# Patient Record
Sex: Female | Born: 1985 | Race: White | Hispanic: No | Marital: Married | Smoking: Never smoker
Health system: Southern US, Community
[De-identification: ages and names within clinical notes are randomized; demographics above are authoritative.]

## PROBLEM LIST (undated history)

## (undated) DIAGNOSIS — R519 Headache, unspecified: Secondary | ICD-10-CM

## (undated) DIAGNOSIS — F419 Anxiety disorder, unspecified: Secondary | ICD-10-CM

## (undated) DIAGNOSIS — D649 Anemia, unspecified: Secondary | ICD-10-CM

## (undated) DIAGNOSIS — F32A Depression, unspecified: Secondary | ICD-10-CM

## (undated) DIAGNOSIS — R87619 Unspecified abnormal cytological findings in specimens from cervix uteri: Secondary | ICD-10-CM

## (undated) DIAGNOSIS — N390 Urinary tract infection, site not specified: Secondary | ICD-10-CM

## (undated) DIAGNOSIS — F329 Major depressive disorder, single episode, unspecified: Secondary | ICD-10-CM

## (undated) DIAGNOSIS — F988 Other specified behavioral and emotional disorders with onset usually occurring in childhood and adolescence: Secondary | ICD-10-CM

## (undated) DIAGNOSIS — Z8759 Personal history of other complications of pregnancy, childbirth and the puerperium: Secondary | ICD-10-CM

## (undated) DIAGNOSIS — O1490 Unspecified pre-eclampsia, unspecified trimester: Secondary | ICD-10-CM

## (undated) DIAGNOSIS — M67471 Ganglion, right ankle and foot: Secondary | ICD-10-CM

## (undated) DIAGNOSIS — B019 Varicella without complication: Secondary | ICD-10-CM

## (undated) DIAGNOSIS — J309 Allergic rhinitis, unspecified: Secondary | ICD-10-CM

## (undated) DIAGNOSIS — R87629 Unspecified abnormal cytological findings in specimens from vagina: Secondary | ICD-10-CM

## (undated) HISTORY — DX: Ganglion, right ankle and foot: M67.471

## (undated) HISTORY — DX: Other specified behavioral and emotional disorders with onset usually occurring in childhood and adolescence: F98.8

## (undated) HISTORY — DX: Unspecified pre-eclampsia, unspecified trimester: O14.90

## (undated) HISTORY — DX: Personal history of other complications of pregnancy, childbirth and the puerperium: Z87.59

## (undated) HISTORY — DX: Major depressive disorder, single episode, unspecified: F32.9

## (undated) HISTORY — DX: Allergic rhinitis, unspecified: J30.9

## (undated) HISTORY — DX: Depression, unspecified: F32.A

## (undated) HISTORY — DX: Varicella without complication: B01.9

## (undated) HISTORY — DX: Headache, unspecified: R51.9

## (undated) HISTORY — DX: Urinary tract infection, site not specified: N39.0

## (undated) HISTORY — DX: Anemia, unspecified: D64.9

## (undated) HISTORY — DX: Unspecified abnormal cytological findings in specimens from cervix uteri: R87.619

## (undated) HISTORY — DX: Anxiety disorder, unspecified: F41.9

---

## 2009-01-22 ENCOUNTER — Emergency Department: Admit: 2009-01-22 | Payer: Self-pay | Source: Emergency Department | Admitting: Emergency Medicine

## 2009-01-22 LAB — CBC AND DIFFERENTIAL
Basophils Absolute: 0 /mm3 (ref 0.0–0.2)
Basophils: 0 % (ref 0–2)
Eosinophils Absolute: 0 /mm3 (ref 0.0–0.7)
Eosinophils: 0 % (ref 0–5)
Granulocytes Absolute: 6.6 /mm3 (ref 1.8–8.1)
Hematocrit: 39.1 % (ref 37.0–47.0)
Hgb: 12.9 G/DL (ref 12.0–16.0)
Immature Granulocytes Absolute: 0
Immature Granulocytes: 0 %
Lymphocytes Absolute: 3.6 /mm3 (ref 0.5–4.4)
Lymphocytes: 34 % (ref 15–41)
MCH: 28 PG (ref 28.0–32.0)
MCHC: 33 G/DL (ref 32.0–36.0)
MCV: 85 FL (ref 80.0–100.0)
MPV: 10.9 FL (ref 9.4–12.3)
Monocytes Absolute: 0.5 /mm3 (ref 0.0–1.2)
Monocytes: 4 % (ref 0–11)
Neutrophils %: 61 % (ref 52–75)
Platelets: 274 /mm3 (ref 140–400)
RBC: 4.6 /mm3 (ref 4.20–5.40)
RDW: 13.2 % (ref 11.5–15.0)
WBC: 10.81 /mm3 — ABNORMAL HIGH (ref 3.50–10.80)

## 2009-01-22 LAB — GFR

## 2009-01-22 LAB — BASIC METABOLIC PANEL
BUN: 8 mg/dL (ref 8–20)
CO2: 21 mEq/L (ref 21–30)
Calcium: 8.9 mg/dL (ref 8.6–10.2)
Chloride: 107 mEq/L (ref 98–107)
Creatinine: 0.7 mg/dL (ref 0.6–1.5)
Glucose: 97 mg/dL (ref 70–100)
Potassium: 3.5 mEq/L — ABNORMAL LOW (ref 3.6–5.0)
Sodium: 140 mEq/L (ref 136–146)

## 2009-01-22 LAB — ETHANOL: Alcohol: 158 mg/dL

## 2009-10-31 HISTORY — PX: REFRACTIVE SURGERY: SHX103

## 2014-05-08 LAB — LIPID PANEL
Cholesterol: 204 — AB (ref 0–200)
HDL: 93 — AB (ref 35–70)
LDL CALC: 103
LDL/HDL RATIO: 2.2
TRIGLYCERIDES: 39 — AB (ref 40–160)

## 2014-05-08 LAB — CBC AND DIFFERENTIAL
HEMATOCRIT: 42 (ref 36–46)
HEMOGLOBIN: 13.8 (ref 12.0–16.0)
Neutrophils Absolute: 5
PLATELETS: 308 (ref 150–399)
WBC: 7.6

## 2014-05-08 LAB — VITAMIN B12: Vitamin B-12: 672

## 2014-05-08 LAB — HEPATIC FUNCTION PANEL
ALK PHOS: 56 (ref 25–125)
ALT: 17 (ref 7–35)
AST: 17 (ref 13–35)
BILIRUBIN, TOTAL: 0.4

## 2014-05-08 LAB — BASIC METABOLIC PANEL
BUN: 14 (ref 4–21)
CREATININE: 0.8 (ref 0.5–1.1)
Glucose: 94
Potassium: 4.2 (ref 3.4–5.3)
Sodium: 138 (ref 137–147)

## 2014-05-08 LAB — HEMOGLOBIN A1C: Hemoglobin A1C: 5.2

## 2014-05-08 LAB — TSH: TSH: 0.62 (ref 0.41–5.90)

## 2014-05-08 LAB — VITAMIN D 25 HYDROXY (VIT D DEFICIENCY, FRACTURES): VIT D 25 HYDROXY: 25

## 2015-05-14 LAB — HEPATIC FUNCTION PANEL
ALT: 15 (ref 7–35)
AST: 23 (ref 13–35)
Alkaline Phosphatase: 47 (ref 25–125)
BILIRUBIN, TOTAL: 0.4

## 2015-05-14 LAB — LIPID PANEL
CHOLESTEROL: 191 (ref 0–200)
HDL: 71 — AB (ref 35–70)
LDL CALC: 108
TRIGLYCERIDES: 62 (ref 40–160)

## 2015-05-14 LAB — BASIC METABOLIC PANEL
BUN: 11 (ref 4–21)
Creatinine: 0.6 (ref 0.5–1.1)
Glucose: 81
Potassium: 4 (ref 3.4–5.3)
SODIUM: 136 — AB (ref 137–147)

## 2015-05-14 LAB — CBC AND DIFFERENTIAL
HCT: 39 (ref 36–46)
HEMOGLOBIN: 12.8 (ref 12.0–16.0)
NEUTROS ABS: 4
PLATELETS: 321 (ref 150–399)
WBC: 7.2

## 2015-05-14 LAB — TSH: TSH: 0.97 (ref 0.41–5.90)

## 2015-05-14 LAB — HEMOGLOBIN A1C: Hemoglobin A1C: 5

## 2015-05-14 LAB — VITAMIN D 25 HYDROXY (VIT D DEFICIENCY, FRACTURES): Vit D, 25-Hydroxy: 44.3

## 2016-04-14 ENCOUNTER — Encounter: Payer: Self-pay | Admitting: Advanced Practice Midwife

## 2016-04-14 ENCOUNTER — Ambulatory Visit: Payer: No Typology Code available for payment source | Admitting: Advanced Practice Midwife

## 2016-04-14 VITALS — BP 109/72 | Ht 63.0 in | Wt 173.4 lb

## 2016-04-14 DIAGNOSIS — Z3A08 8 weeks gestation of pregnancy: Secondary | ICD-10-CM

## 2016-04-14 DIAGNOSIS — Z369 Encounter for antenatal screening, unspecified: Secondary | ICD-10-CM

## 2016-04-14 NOTE — Progress Notes (Signed)
Last pap 10/2014  Yes cats  No travel to [place] country  Yes chicken pox

## 2016-04-14 NOTE — Progress Notes (Signed)
Subjective  This is the initial OB visit for Casey Banks she is a G1P0 who is a new patient in our practice.  She is at 8 wks and 0 days per LMP. Patient's last menstrual period was 02/18/2016. She has not yet had a dating ultrasound.  Estimated Date of Delivery: 11/24/16.  Menstrual cycles are slightyly irregular, cycles every 28-32 days.  Her obstetrical history is significant for: None first pregnancy. Her medical history is significant for depression and anxiety she was taking welbutrin but stopped when she found out she was pregnant.  Relationship status:FOB/Husband living together. Patient does intend to breast feed. Current complaints include very mild nausea and breast tenderness.    The following portions of the patient's history were reviewed and updated as needed: allergies, current medications, past family history, past medical history, past surgical history, social history and problem list.    Objective  Physical exam as noted in chart.    Assessment:   Presumed IUP at 8 wks 0 days      Plan:  Initial Ob labs drawn, beta hcg and CF  Pap Smear 10/2014 normal  CF-drawn today  Discussed prenatal vitamins, iron and stool softeners. Nutrition and weight gain.   Reviewed prenatal booklet  Questions answered   Role of Ultrasound in pregnancy discussed, including Level II anatomy scan at 20 weeks. Order given for dating/viability sono.   Discussed genetic testing as appropriate (CF, SMA, Fragile X, Tay-Sachs):   Discussed prenatal screenings and diagnostic tests options reviewed (NT, CVS, Amniocentesis, free cell DNA testing): Order given for NT at [facility]  AFP/Quad screen discussed as appropriate,will draw between 15-18 weeks if pt requests  Referrals - [facility] for NT, and order for dating/viability sono.   Follow up in 4 weeks

## 2016-04-15 ENCOUNTER — Encounter (HOSPITAL_BASED_OUTPATIENT_CLINIC_OR_DEPARTMENT_OTHER): Payer: Self-pay

## 2016-04-15 ENCOUNTER — Encounter: Payer: Self-pay | Admitting: Advanced Practice Midwife

## 2016-04-15 ENCOUNTER — Other Ambulatory Visit: Payer: Self-pay | Admitting: Advanced Practice Midwife

## 2016-04-15 LAB — TOXOPLASMA ABS, IGG/IGM, SERUM
Toxoplasma Gondii, IgG: <3 IU/mL (ref 0.0–7.1)
Toxoplasma gondii Ab,IgM,Qn: <3 AU/mL (ref 0.0–7.9)

## 2016-04-16 LAB — OB PANEL: CBCD,UA/CX, ABO/RH/AB SCREEN, RPR-RFX, HIV, HBSAG, HCV, RUB
AB Screen Gel: NEGATIVE
Baso(Absolute): 0 10*3/uL (ref 0.0–0.2)
Basos: 0 %
Bilirubin, UA: NEGATIVE
Blood, UA: NEGATIVE
Eos: 3 %
Eosinophils Absolute: 0.2 10*3/uL (ref 0.0–0.4)
Glucose, UA: NEGATIVE
HCV AB: <0.1 s/co ratio (ref 0.0–0.9)
HIV Screen 4th Generation wRfx: NONREACTIVE
Hematocrit: 36.1 % (ref 34.0–46.6)
Hemoglobin: 11.4 g/dL (ref 11.1–15.9)
Hepatitis B Surface Antigen: NEGATIVE
Immature Granulocytes Absolute: 0 10*3/uL (ref 0.0–0.1)
Immature Granulocytes: 0 %
Ketones UA: NEGATIVE
Leukocyte Esterase, UA: NEGATIVE
Lymphocytes Absolute: 2.3 10*3/uL (ref 0.7–3.1)
Lymphocytes: 24 %
MCH: 28.9 pg (ref 26.6–33.0)
MCHC: 31.6 g/dL (ref 31.5–35.7)
MCV: 91 fL (ref 79–97)
Monocytes Absolute: 0.6 10*3/uL (ref 0.1–0.9)
Monocytes: 6 %
Neutrophils Absolute: 6.5 10*3/uL (ref 1.4–7.0)
Neutrophils: 67 %
Nitrite, UA: NEGATIVE
Platelets: 331 10*3/uL (ref 150–379)
Protein, UA: NEGATIVE
RBC: 3.95 x10E6/uL (ref 3.77–5.28)
RDW: 13.6 % (ref 12.3–15.4)
RPR: NONREACTIVE
Result 1:: NO GROWTH
Rh Factor: POSITIVE
Rubella AB, IgG: 2.07 index (ref >0.99)
Urine Specific Gravity: 1.01 (ref 1.005–1.030)
Urobilinogen, Ur: 0.2 mg/dL (ref 0.2–1.0)
WBC: 9.6 10*3/uL (ref 3.4–10.8)
pH, UA: 7 (ref 5.0–7.5)

## 2016-04-16 LAB — HCG QUANTITATIVE: human chorionic gonadotropin (hCG), Beta Chain, Quant., S: 60098 m[IU]/mL

## 2016-04-16 LAB — INTERPRETATION:

## 2016-04-18 NOTE — Progress Notes (Signed)
Quick Note:    Dating/viability sono consistent with LMP. + FHR.  ______

## 2016-04-18 NOTE — Progress Notes (Signed)
Quick Note:    Normal prenatal labs. Toxo negative. Waiting on CF.  ______

## 2016-04-22 ENCOUNTER — Telehealth: Payer: Self-pay | Admitting: Advanced Practice Midwife

## 2016-04-22 LAB — CYSTIC FIBROSIS (CF) PROFILE, 97 MUTATIONS, CF PLUS: Interpretation:: NOT DETECTED

## 2016-04-22 NOTE — Telephone Encounter (Signed)
Spoke with patient and gave negative CF screening results.

## 2016-04-22 NOTE — Progress Notes (Signed)
Quick Note:    CF negative, please let her know. Thanks  ______

## 2016-04-27 ENCOUNTER — Other Ambulatory Visit: Payer: Self-pay | Admitting: Obstetrics & Gynecology

## 2016-04-27 ENCOUNTER — Other Ambulatory Visit: Payer: Self-pay | Admitting: Advanced Practice Midwife

## 2016-04-27 DIAGNOSIS — Z369 Encounter for antenatal screening, unspecified: Secondary | ICD-10-CM

## 2016-04-27 DIAGNOSIS — Z3682 Encounter for antenatal screening for nuchal translucency: Secondary | ICD-10-CM

## 2016-05-09 ENCOUNTER — Ambulatory Visit: Payer: No Typology Code available for payment source | Admitting: Obstetrics and Gynecology

## 2016-05-09 VITALS — BP 112/70 | Wt 171.0 lb

## 2016-05-09 DIAGNOSIS — Z3A11 11 weeks gestation of pregnancy: Secondary | ICD-10-CM

## 2016-05-09 DIAGNOSIS — Z369 Encounter for antenatal screening, unspecified: Secondary | ICD-10-CM

## 2016-05-09 LAB — POCT URINALYSIS DIPSTICK (5)
Glucose, UA POCT: NEGATIVE mg/dL
Protein, UA POCT: NEGATIVE mg/dL

## 2016-05-09 NOTE — Progress Notes (Signed)
NT this week.  Doing well.

## 2016-05-13 ENCOUNTER — Inpatient Hospital Stay
Admission: RE | Admit: 2016-05-13 | Discharge: 2016-05-13 | Disposition: A | Discharge status: Home / Self Care | Payer: No Typology Code available for payment source | Source: Ambulatory Visit | Attending: Obstetrics & Gynecology | Admitting: Obstetrics & Gynecology

## 2016-05-13 ENCOUNTER — Inpatient Hospital Stay (HOSPITAL_BASED_OUTPATIENT_CLINIC_OR_DEPARTMENT_OTHER)
Admission: RE | Admit: 2016-05-13 | Discharge: 2016-05-13 | Disposition: A | Discharge status: Home / Self Care | Payer: No Typology Code available for payment source | Source: Ambulatory Visit | Attending: Obstetrics & Gynecology | Admitting: Obstetrics & Gynecology

## 2016-05-13 ENCOUNTER — Other Ambulatory Visit: Payer: Self-pay | Admitting: Obstetrics & Gynecology

## 2016-05-13 DIAGNOSIS — Z3682 Encounter for antenatal screening for nuchal translucency: Secondary | ICD-10-CM

## 2016-05-13 DIAGNOSIS — Z369 Encounter for antenatal screening, unspecified: Secondary | ICD-10-CM

## 2016-05-13 DIAGNOSIS — Z3A12 12 weeks gestation of pregnancy: Secondary | ICD-10-CM | POA: Insufficient documentation

## 2016-05-13 DIAGNOSIS — O26891 Other specified pregnancy related conditions, first trimester: Secondary | ICD-10-CM

## 2016-05-13 DIAGNOSIS — O359XX0 Maternal care for (suspected) fetal abnormality and damage, unspecified, not applicable or unspecified: Secondary | ICD-10-CM

## 2016-05-13 DIAGNOSIS — Z36 Encounter for antenatal screening of mother: Secondary | ICD-10-CM | POA: Insufficient documentation

## 2016-05-13 NOTE — Progress Notes (Signed)
Quick Note:    Normal ultrasound findings of nuchal screen. Await final result.  ______

## 2016-05-23 ENCOUNTER — Telehealth (HOSPITAL_BASED_OUTPATIENT_CLINIC_OR_DEPARTMENT_OTHER): Payer: Self-pay

## 2016-05-25 ENCOUNTER — Encounter: Payer: Self-pay | Admitting: Nurse Practitioner

## 2016-05-27 ENCOUNTER — Telehealth: Payer: Self-pay

## 2016-05-27 NOTE — Progress Notes (Signed)
Quick Note:    NT normal; please inform patient. Thank you  ______

## 2016-05-27 NOTE — Telephone Encounter (Signed)
Pt advised of NL NT.

## 2016-06-08 ENCOUNTER — Ambulatory Visit: Payer: No Typology Code available for payment source | Admitting: Nurse Practitioner

## 2016-06-08 VITALS — BP 111/73 | Wt 179.4 lb

## 2016-06-08 DIAGNOSIS — Z369 Encounter for antenatal screening, unspecified: Secondary | ICD-10-CM

## 2016-06-08 DIAGNOSIS — Z3A15 15 weeks gestation of pregnancy: Secondary | ICD-10-CM

## 2016-06-08 LAB — POCT URINALYSIS DIPSTICK (5)
Glucose, UA POCT: NEGATIVE mg/dL
Protein, UA POCT: NEGATIVE mg/dL

## 2016-06-08 NOTE — Progress Notes (Signed)
Questions answered. AFP today. Level II order provided

## 2016-06-10 ENCOUNTER — Telehealth: Payer: Self-pay

## 2016-06-10 LAB — ALPHA FETOPROTEIN, MATERNAL
Alpha-fetoprotein Maternal Screen: 27.1 ng/mL
Gestational Age Based On:: 15.9 weeks
MSAFP MoM: 0.94
Maternal Age At EDD: 31.9 years
Osb Risk: 10000
PDF Image: 0
Results:: NEGATIVE
Weight: 179 [lb_av]

## 2016-06-10 NOTE — Telephone Encounter (Signed)
-----   Message from Caren Macadam, NP sent at 06/10/2016  9:59 AM EDT -----  AFP negative; please inform patient. Thank you

## 2016-06-10 NOTE — Telephone Encounter (Signed)
Pt aware of Nl AFP results

## 2016-06-10 NOTE — Progress Notes (Signed)
Quick Note:    AFP negative; please inform patient. Thank you  ______

## 2016-07-08 ENCOUNTER — Ambulatory Visit: Payer: No Typology Code available for payment source | Admitting: Obstetrics & Gynecology

## 2016-07-08 ENCOUNTER — Inpatient Hospital Stay
Admission: RE | Admit: 2016-07-08 | Discharge: 2016-07-08 | Disposition: A | Discharge status: Home / Self Care | Payer: No Typology Code available for payment source | Source: Ambulatory Visit | Attending: Obstetrics & Gynecology | Admitting: Obstetrics & Gynecology

## 2016-07-08 VITALS — BP 110/70 | Wt 184.0 lb

## 2016-07-08 DIAGNOSIS — Z3A20 20 weeks gestation of pregnancy: Secondary | ICD-10-CM

## 2016-07-08 DIAGNOSIS — O359XX0 Maternal care for (suspected) fetal abnormality and damage, unspecified, not applicable or unspecified: Secondary | ICD-10-CM | POA: Insufficient documentation

## 2016-07-08 DIAGNOSIS — Z369 Encounter for antenatal screening, unspecified: Secondary | ICD-10-CM

## 2016-07-08 DIAGNOSIS — O26892 Other specified pregnancy related conditions, second trimester: Secondary | ICD-10-CM

## 2016-07-08 DIAGNOSIS — Z36 Encounter for antenatal screening of mother: Secondary | ICD-10-CM

## 2016-07-08 LAB — POCT URINALYSIS DIPSTICK (5)
Glucose, UA POCT: NEGATIVE mg/dL
Protein, UA POCT: NEGATIVE mg/dL

## 2016-07-08 NOTE — Progress Notes (Signed)
Nl Level II sono today.  Anterior placenta.  Having a boy!  Hospital packet given.

## 2016-07-08 NOTE — Progress Notes (Signed)
Quick Note:    Normal Level II anatomy scan.  ______

## 2016-08-03 ENCOUNTER — Ambulatory Visit: Payer: No Typology Code available for payment source | Admitting: Obstetrics & Gynecology

## 2016-08-03 VITALS — BP 120/70 | Wt 192.2 lb

## 2016-08-03 DIAGNOSIS — Z3A23 23 weeks gestation of pregnancy: Secondary | ICD-10-CM

## 2016-08-03 DIAGNOSIS — Z369 Encounter for antenatal screening, unspecified: Secondary | ICD-10-CM

## 2016-08-03 NOTE — Progress Notes (Signed)
Some FM. No complaints except that the pt does not feel regular FM yet. I reassured the pt that with an anterior placenta she may feel less movement, later. Will get the glucola in the next 2 weeks. Recommended getting the flu shot. Had the tdap within the past year. Had the hospital tour.

## 2016-08-04 LAB — POCT URINALYSIS DIPSTICK (5)
Glucose, UA POCT: NEGATIVE mg/dL
Protein, UA POCT: NEGATIVE mg/dL

## 2016-08-31 ENCOUNTER — Ambulatory Visit: Payer: No Typology Code available for payment source | Admitting: Obstetrics & Gynecology

## 2016-08-31 VITALS — BP 116/74 | Wt 196.0 lb

## 2016-08-31 DIAGNOSIS — Z369 Encounter for antenatal screening, unspecified: Secondary | ICD-10-CM

## 2016-08-31 DIAGNOSIS — Z3A27 27 weeks gestation of pregnancy: Secondary | ICD-10-CM

## 2016-08-31 LAB — POCT URINALYSIS DIPSTICK (5)
Glucose, UA POCT: NEGATIVE mg/dL
Protein, UA POCT: NEGATIVE mg/dL

## 2016-08-31 NOTE — Progress Notes (Signed)
Will do glucola tomorrow. Counseled on influenza vaccine and circumcision.

## 2016-09-21 LAB — CBC AND DIFFERENTIAL
Baso(Absolute): 0 10*3/uL (ref 0.0–0.2)
Basos: 0 %
Eos: 1 %
Eosinophils Absolute: 0.1 10*3/uL (ref 0.0–0.4)
Hematocrit: 33.1 % — ABNORMAL LOW (ref 34.0–46.6)
Hemoglobin: 10.7 g/dL — ABNORMAL LOW (ref 11.1–15.9)
Immature Granulocytes Absolute: 0 10*3/uL (ref 0.0–0.1)
Immature Granulocytes: 0 %
Lymphocytes Absolute: 1 10*3/uL (ref 0.7–3.1)
Lymphocytes: 12 %
MCH: 27.2 pg (ref 26.6–33.0)
MCHC: 32.3 g/dL (ref 31.5–35.7)
MCV: 84 fL (ref 79–97)
Monocytes Absolute: 0.5 10*3/uL (ref 0.1–0.9)
Monocytes: 5 %
Neutrophils Absolute: 6.8 10*3/uL (ref 1.4–7.0)
Neutrophils: 82 %
Platelets: 232 10*3/uL (ref 150–379)
RBC: 3.94 x10E6/uL (ref 3.77–5.28)
RDW: 13.5 % (ref 12.3–15.4)
WBC: 8.5 10*3/uL (ref 3.4–10.8)

## 2016-09-21 LAB — GESTATIONAL DIABETES 1-HOUR SCREEN, SERUM: Gestational Diabetes Screen: 159 mg/dL — ABNORMAL HIGH (ref 65–139)

## 2016-09-21 NOTE — Progress Notes (Signed)
Please call patient and inform her of her results.  Glucola elevated and H/H low.  Needs 3 hour GTT and Vitron C qd.

## 2016-09-26 ENCOUNTER — Telehealth: Payer: Self-pay

## 2016-09-26 DIAGNOSIS — O99810 Abnormal glucose complicating pregnancy: Secondary | ICD-10-CM

## 2016-09-26 NOTE — Telephone Encounter (Signed)
Pt aware of need for Iron and 3 hour gtt.  Pt will get later this week. Has appointment here tomorrow.

## 2016-09-27 ENCOUNTER — Ambulatory Visit: Payer: No Typology Code available for payment source | Admitting: Obstetrics & Gynecology

## 2016-09-27 VITALS — BP 116/72 | Wt 202.0 lb

## 2016-09-27 DIAGNOSIS — Z369 Encounter for antenatal screening, unspecified: Secondary | ICD-10-CM

## 2016-09-27 DIAGNOSIS — Z3A31 31 weeks gestation of pregnancy: Secondary | ICD-10-CM

## 2016-09-27 LAB — POCT UA URISTIX
Glucose, UA POCT: NEGATIVE mg/dL
POCT Protein, UA: NEGATIVE mg/dL

## 2016-09-27 NOTE — Progress Notes (Signed)
+  GFM  Will do 3 hr GTT  Lovely couple. Hope to go naturally
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
## 2016-10-12 LAB — GESTATIONAL GLUCOSE TOLERANCE, SERUM
Glucose, GTT - 1 Hour: 184 mg/dL — ABNORMAL HIGH (ref 65–179)
Glucose, GTT - 2 Hour: 140 mg/dL (ref 65–154)
Glucose, GTT - 3 Hour: 91 mg/dL (ref 65–139)
Glucose, GTT - Fasting: 89 mg/dL (ref 65–94)

## 2016-10-13 ENCOUNTER — Telehealth: Payer: Self-pay

## 2016-10-13 NOTE — Telephone Encounter (Signed)
Pt aware of results, normal 3 hour GTT

## 2016-10-13 NOTE — Progress Notes (Signed)
Normal 3h gtt, please notify patient

## 2016-10-14 ENCOUNTER — Ambulatory Visit: Payer: No Typology Code available for payment source | Admitting: Obstetrics and Gynecology

## 2016-10-14 VITALS — BP 110/70 | Wt 210.0 lb

## 2016-10-14 DIAGNOSIS — Z3A34 34 weeks gestation of pregnancy: Secondary | ICD-10-CM

## 2016-10-14 DIAGNOSIS — Z369 Encounter for antenatal screening, unspecified: Secondary | ICD-10-CM

## 2016-10-14 NOTE — Progress Notes (Signed)
S/p TDAP, will get flu vaccine.  Discussed common discomforts of pregnancy.

## 2016-10-25 ENCOUNTER — Encounter: Payer: Self-pay | Admitting: Nurse Practitioner

## 2016-10-26 ENCOUNTER — Ambulatory Visit: Payer: No Typology Code available for payment source | Admitting: Obstetrics and Gynecology

## 2016-10-26 VITALS — BP 130/90 | Wt 219.4 lb

## 2016-10-26 DIAGNOSIS — Z3685 Encounter for antenatal screening for Streptococcus B: Secondary | ICD-10-CM

## 2016-10-26 DIAGNOSIS — Z369 Encounter for antenatal screening, unspecified: Secondary | ICD-10-CM

## 2016-10-26 DIAGNOSIS — Z3A35 35 weeks gestation of pregnancy: Secondary | ICD-10-CM

## 2016-10-26 DIAGNOSIS — R350 Frequency of micturition: Secondary | ICD-10-CM

## 2016-10-26 LAB — POCT URINALYSIS DIPSTICK (5)
Glucose, UA POCT: NEGATIVE mg/dL
Protein, UA POCT: NEGATIVE mg/dL

## 2016-10-26 NOTE — Addendum Note (Signed)
Addended by: Fulton Reek on: 10/26/2016 05:16 PM     Modules accepted: Orders

## 2016-10-26 NOTE — Progress Notes (Signed)
C/o frequency, inability to emptying bladder, since Sunday.

## 2016-10-26 NOTE — Progress Notes (Signed)
Repeat BP normal.  C/o frequency - will send UCX.  GBS today.  Many questions answered about her birth plan, EFM, episiotomies, etc.

## 2016-10-27 ENCOUNTER — Encounter: Payer: Self-pay | Admitting: Obstetrics & Gynecology

## 2016-10-28 ENCOUNTER — Ambulatory Visit: Payer: No Typology Code available for payment source | Admitting: Obstetrics & Gynecology

## 2016-10-28 LAB — URINE CULTURE

## 2016-10-28 LAB — GROUP B STREPTOCOCCUS COLONIZATION DETECTION, NAA: Strep Gp B NAA: NEGATIVE

## 2016-10-28 NOTE — Progress Notes (Signed)
GBS is negative.

## 2016-10-31 NOTE — L&D Delivery Note (Signed)
Cesarean Section Procedure Note    Indications: Preeclampsia    Pre-operative Diagnosis: 37 week 5 day pregnancy.    Post-operative Diagnosis: same    Surgeon: Starleen Blue     Assistants: Rosanne Ashing    Anesthesia: Spinal anesthesia    ASA Class: 2    Procedure    Procedure Details   The patient was seen in the Holding Room. The risks, benefits, and complications of the procedure and post-op course were discussed with the patient to include, but not limited to: bleeding, infection, hemorrhage, transfusion, bowel/bladder/ureteral damage, PE, phlebitis, ileus, bowel obstruction, major blood vessel damage, wound dehiscence etc.. Treatment options and expected outcomes were also discussed with the patient.  The patient concurred with the proposed plan and, after her questions were answered, signed the informed consent. The patient was taken to the Operating Room, identified as Florie Puffenbarger and the procedure verified as C-Section Delivery. A Time Out was held and the above information was confirmed.    After adequate spinal/epidural anesthesia was assured, the patient was prepped and draped in the usual sterile manner. Foley catheter was noted to be draining clear, yellow urine. A Pfannenstiel skin incision was made and carried down through the subcutaneous tissue to the underlying fascia, which was incised and extended transversely. The fascia was dissected from the underlying rectus muscle and the rectus muscle was separated in the midline. The peritoneum was identified and entered bluntly. Peritoneal incision was extended both superiorly and inferiorly. A bladder flap was created and the bladder was bluntly freed from the lower uterine segment. Bladder blade was inserted and a transverse incision was made in the lower uterine segment. A viable female infant, weighing 7 lbs 0 oz with Apgar scores of 9 at one minute and 9 at five minutes was delivered from the OP presentation and handed to the awaiting neonatal  personnel. After the umbilical cord was clamped and cut, cord blood was obtained for evaluation.  The umbilical cord was clamped and cut. The placenta was manually removed intact, appeared to be normal, and was handed to the cord blood personnel, in attendance. The uterus, tubes and ovaries also appeared within normal limits. The uterus was wiped clean and the uterine incision was closed in two layers using 0-monocryl suture, with the second layer imbricating the first. Gutters were cleared of blood and blood clots and excellent hemostasis was noted. The peritoneum and muscle were closed with 0-chromic suture in a running non-locked fashion. The fascia was reapproximated using two sutures of 0 Vicryl, running lateral to medial, in an intermittently locked fashion. Subcutaneous tissue was copiously irrigated and bleeding points coagulated, with hemostasis maintained throughout. The subcutaneous tissue was reapproximated with 3-0 plain suture and the skin was closed with 4-0 monocryl suture in a subcuticular manner. Half-inch SteriStrips and a sterile dressing were applied.     Sponge needle, and instrument counts were correct prior to the abdominal closure and at the conclusion of the case.     Findings:  Liveborn female OP; Apgars 9 and 9 7 lbs  Nl placenta and 3 vessel umbilical cord  Normal ovaries and fallopian tubes    Estimated Blood Loss:  500ccs           Drains: Foley clear 100 ccs           Total IV Fluids:           Specimens: none           Complications:  None;  patient tolerated the procedure well.           Disposition: PACU - hemodynamically stable.           Condition: stable    Attending Attestation: I performed the procedure.

## 2016-11-04 ENCOUNTER — Encounter: Payer: Self-pay | Admitting: Nurse Practitioner

## 2016-11-04 ENCOUNTER — Ambulatory Visit: Payer: 59 | Admitting: Obstetrics & Gynecology

## 2016-11-04 VITALS — BP 116/78 | Wt 223.0 lb

## 2016-11-04 DIAGNOSIS — Z369 Encounter for antenatal screening, unspecified: Secondary | ICD-10-CM

## 2016-11-04 DIAGNOSIS — Z3A37 37 weeks gestation of pregnancy: Secondary | ICD-10-CM

## 2016-11-04 LAB — POCT URINALYSIS DIPSTICK (5)
Glucose, UA POCT: NEGATIVE mg/dL
Protein, UA POCT: NEGATIVE mg/dL

## 2016-11-04 NOTE — Progress Notes (Signed)
Labor precautions given.  L&D consent signed.

## 2016-11-08 ENCOUNTER — Telehealth: Payer: Self-pay | Admitting: Obstetrics & Gynecology

## 2016-11-08 ENCOUNTER — Inpatient Hospital Stay (HOSPITAL_BASED_OUTPATIENT_CLINIC_OR_DEPARTMENT_OTHER): Payer: Self-pay | Admitting: Obstetrics & Gynecology

## 2016-11-08 ENCOUNTER — Observation Stay (HOSPITAL_BASED_OUTPATIENT_CLINIC_OR_DEPARTMENT_OTHER): Payer: Commercial Managed Care - POS | Admitting: Anesthesiology

## 2016-11-08 ENCOUNTER — Ambulatory Visit: Payer: 59

## 2016-11-08 ENCOUNTER — Encounter (HOSPITAL_BASED_OUTPATIENT_CLINIC_OR_DEPARTMENT_OTHER): Payer: Self-pay

## 2016-11-08 ENCOUNTER — Inpatient Hospital Stay
Admission: AD | Admit: 2016-11-08 | Discharge: 2016-11-12 | DRG: 766 | Disposition: A | Discharge status: Home / Self Care | Payer: Commercial Managed Care - POS | Source: Ambulatory Visit | Attending: Obstetrics & Gynecology | Admitting: Obstetrics & Gynecology

## 2016-11-08 ENCOUNTER — Encounter (HOSPITAL_BASED_OUTPATIENT_CLINIC_OR_DEPARTMENT_OTHER)
Admission: AD | Disposition: A | Discharge status: Home / Self Care | Payer: Self-pay | Source: Ambulatory Visit | Attending: Obstetrics & Gynecology

## 2016-11-08 VITALS — BP 170/100

## 2016-11-08 DIAGNOSIS — O1494 Unspecified pre-eclampsia, complicating childbirth: Principal | ICD-10-CM | POA: Diagnosis present

## 2016-11-08 DIAGNOSIS — O1213 Gestational proteinuria, third trimester: Secondary | ICD-10-CM

## 2016-11-08 DIAGNOSIS — O169 Unspecified maternal hypertension, unspecified trimester: Secondary | ICD-10-CM

## 2016-11-08 DIAGNOSIS — Z3A37 37 weeks gestation of pregnancy: Secondary | ICD-10-CM

## 2016-11-08 DIAGNOSIS — O1490 Unspecified pre-eclampsia, unspecified trimester: Secondary | ICD-10-CM | POA: Diagnosis present

## 2016-11-08 DIAGNOSIS — Z3480 Encounter for supervision of other normal pregnancy, unspecified trimester: Secondary | ICD-10-CM

## 2016-11-08 LAB — CBC
Absolute NRBC: 0 10*3/uL
Hematocrit: 33.4 % — ABNORMAL LOW (ref 37.0–47.0)
Hgb: 10.7 g/dL — ABNORMAL LOW (ref 12.0–16.0)
MCH: 26.5 pg — ABNORMAL LOW (ref 28.0–32.0)
MCHC: 32 g/dL (ref 32.0–36.0)
MCV: 82.7 fL (ref 80.0–100.0)
MPV: 13.9 fL — ABNORMAL HIGH (ref 9.4–12.3)
Nucleated RBC: 0 /100 WBC (ref 0.0–1.0)
Platelets: 216 10*3/uL (ref 140–400)
RBC: 4.04 10*6/uL — ABNORMAL LOW (ref 4.20–5.40)
RDW: 15 % (ref 12–15)
WBC: 9.73 10*3/uL (ref 3.50–10.80)

## 2016-11-08 LAB — GFR: EGFR: >60

## 2016-11-08 LAB — PROTEIN / CREATININE RATIO, URINE
Urine Creatinine, Random: 70 mg/dL
Urine Protein Random: 78.4 mg/dL — ABNORMAL HIGH (ref 1.0–14.0)
Urine Protein/Creatinine Ratio: 1.1

## 2016-11-08 LAB — URINALYSIS WITH MICROSCOPIC
Bilirubin, UA: NEGATIVE
Glucose, UA: NEGATIVE
Ketones UA: NEGATIVE
Leukocyte Esterase, UA: NEGATIVE
Nitrite, UA: NEGATIVE
Protein, UR: 100 — AB
Specific Gravity UA: 1.016 (ref 1.001–1.035)
Urine pH: 7 (ref 5.0–8.0)
Urobilinogen, UA: NORMAL mg/dL

## 2016-11-08 LAB — CREATININE, SERUM: Creatinine: 0.7 mg/dL (ref 0.6–1.0)

## 2016-11-08 LAB — POCT UA URISTIX: Glucose, UA POCT: NEGATIVE mg/dL

## 2016-11-08 LAB — PT AND APTT
PT INR: 0.9 (ref 0.9–1.1)
PT: 11.7 s — ABNORMAL LOW (ref 12.6–15.0)
PTT: 28 s (ref 23–37)

## 2016-11-08 LAB — ALT: ALT: 35 U/L (ref 0–55)

## 2016-11-08 LAB — LACTATE DEHYDROGENASE: LDH: 195 U/L (ref 125–331)

## 2016-11-08 LAB — TYPE AND SCREEN
AB Screen Gel: NEGATIVE
ABO Rh: A POS

## 2016-11-08 LAB — HAS ZIKA TESTING BEEN PERFORMED

## 2016-11-08 LAB — AST: AST (SGOT): 31 U/L (ref 5–34)

## 2016-11-08 LAB — FIBRINOGEN: Fibrinogen: 711 mg/dL — ABNORMAL HIGH (ref 189–458)

## 2016-11-08 LAB — URIC ACID: Uric acid: 5.6 mg/dL (ref 2.6–6.0)

## 2016-11-08 SURGERY — Surgical Case
Anesthesia: Regional | Site: Abdomen | Wound class: Clean Contaminated

## 2016-11-08 MED ORDER — BUPIVACAINE IN DEXTROSE 0.75-8.25 % IT SOLN
INTRATHECAL | Status: DC | PRN
Start: 2016-11-08 — End: 2016-11-08

## 2016-11-08 MED ORDER — NALOXONE HCL 0.4 MG/ML IJ SOLN (WRAP)
0.1000 mg | INTRAMUSCULAR | Status: DC | PRN
Start: 2016-11-08 — End: 2016-11-12
  Filled 2016-11-08: qty 1

## 2016-11-08 MED ORDER — MORPHINE SULFATE (PF) 1 MG/ML SYRINGE 1 ML (NICU)
INTRAMUSCULAR | Status: AC
Start: 2016-11-08 — End: ?
  Filled 2016-11-08: qty 1

## 2016-11-08 MED ORDER — ONDANSETRON 4 MG PO TBDP
4.0000 mg | ORAL_TABLET | Freq: Four times a day (QID) | ORAL | Status: DC | PRN
Start: 2016-11-08 — End: 2016-11-09

## 2016-11-08 MED ORDER — OXYTOCIN 10 UNIT/ML IJ SOLN
INTRAMUSCULAR | Status: DC | PRN
Start: 2016-11-08 — End: 2016-11-08
  Administered 2016-11-08: 20 [IU] via INTRAVENOUS

## 2016-11-08 MED ORDER — NALOXONE HCL 0.4 MG/ML IJ SOLN (WRAP)
0.1000 mg | INTRAMUSCULAR | Status: DC | PRN
Start: 2016-11-08 — End: 2016-11-09

## 2016-11-08 MED ORDER — OXYTOCIN 10 UNIT/ML IJ SOLN
INTRAMUSCULAR | Status: AC
Start: 2016-11-08 — End: ?
  Filled 2016-11-08: qty 2

## 2016-11-08 MED ORDER — METOCLOPRAMIDE HCL 5 MG/ML IJ SOLN
10.0000 mg | Freq: Once | INTRAMUSCULAR | Status: DC | PRN
Start: 2016-11-08 — End: 2016-11-12

## 2016-11-08 MED ORDER — OXYTOCIN-LACTATED RINGERS 30 UNIT/500ML IV SOLN
INTRAVENOUS | Status: AC
Start: 2016-11-08 — End: ?
  Filled 2016-11-08: qty 500

## 2016-11-08 MED ORDER — DIPHENHYDRAMINE HCL 50 MG/ML IJ SOLN
12.5000 mg | Freq: Once | INTRAMUSCULAR | Status: DC
Start: 2016-11-08 — End: 2016-11-12
  Filled 2016-11-08: qty 1

## 2016-11-08 MED ORDER — CEFAZOLIN SODIUM 1 G IJ SOLR
INTRAMUSCULAR | Status: DC | PRN
Start: 2016-11-08 — End: 2016-11-08
  Administered 2016-11-08: 2 g via INTRAVENOUS

## 2016-11-08 MED ORDER — HYDRALAZINE HCL 20 MG/ML IJ SOLN
INTRAMUSCULAR | Status: AC
Start: 2016-11-08 — End: ?
  Filled 2016-11-08: qty 1

## 2016-11-08 MED ORDER — ONDANSETRON HCL 4 MG/2ML IJ SOLN
4.0000 mg | Freq: Once | INTRAMUSCULAR | Status: DC | PRN
Start: 2016-11-08 — End: 2016-11-12
  Filled 2016-11-08: qty 2

## 2016-11-08 MED ORDER — FENTANYL CITRATE (PF) 50 MCG/ML IJ SOLN (WRAP)
25.0000 ug | INTRAMUSCULAR | Status: DC | PRN
Start: 2016-11-08 — End: 2016-11-09
  Filled 2016-11-08: qty 2

## 2016-11-08 MED ORDER — HYDRALAZINE HCL 20 MG/ML IJ SOLN
10.0000 mg | Freq: Four times a day (QID) | INTRAMUSCULAR | Status: DC | PRN
Start: 2016-11-08 — End: 2016-11-09

## 2016-11-08 MED ORDER — INFLUENZA VAC SPLIT QUAD 0.5 ML IM SUSY
0.5000 mL | PREFILLED_SYRINGE | Freq: Once | INTRAMUSCULAR | Status: DC
Start: 2016-11-08 — End: 2016-11-12

## 2016-11-08 MED ORDER — LACTATED RINGERS IV SOLN
INTRAVENOUS | Status: DC
Start: 2016-11-08 — End: 2016-11-09

## 2016-11-08 MED ORDER — HYDROMORPHONE HCL 1 MG/ML IJ SOLN
0.5000 mg | INTRAMUSCULAR | Status: DC | PRN
Start: 2016-11-08 — End: 2016-11-09

## 2016-11-08 MED ORDER — ONDANSETRON HCL 4 MG/2ML IJ SOLN
INTRAMUSCULAR | Status: DC | PRN
Start: 2016-11-08 — End: 2016-11-08
  Administered 2016-11-08: 4 mg via INTRAVENOUS

## 2016-11-08 MED ORDER — HYDRALAZINE HCL 20 MG/ML IJ SOLN
5.0000 mg | Freq: Once | INTRAMUSCULAR | Status: AC
Start: 2016-11-08 — End: 2016-11-08
  Administered 2016-11-08: 5 mg via INTRAVENOUS

## 2016-11-08 MED ORDER — IBUPROFEN 600 MG PO TABS
600.0000 mg | ORAL_TABLET | Freq: Four times a day (QID) | ORAL | Status: DC | PRN
Start: 2016-11-08 — End: 2016-11-12
  Filled 2016-11-08: qty 1

## 2016-11-08 MED ORDER — MISOPROSTOL 200 MCG PO TABS
800.0000 ug | ORAL_TABLET | Freq: Once | ORAL | Status: DC | PRN
Start: 2016-11-08 — End: 2016-11-09

## 2016-11-08 MED ORDER — ONDANSETRON HCL 4 MG/2ML IJ SOLN
4.0000 mg | Freq: Once | INTRAMUSCULAR | Status: DC | PRN
Start: 2016-11-08 — End: 2016-11-12

## 2016-11-08 MED ORDER — NIFEDIPINE ER OSMOTIC RELEASE 30 MG PO TB24
30.0000 mg | ORAL_TABLET | Freq: Once | ORAL | Status: AC
Start: 2016-11-09 — End: 2016-11-09
  Administered 2016-11-09: 30 mg via ORAL
  Filled 2016-11-08 (×2): qty 1

## 2016-11-08 MED ORDER — METHYLERGONOVINE MALEATE 0.2 MG/ML IJ SOLN
200.0000 ug | Freq: Once | INTRAMUSCULAR | Status: DC | PRN
Start: 2016-11-08 — End: 2016-11-09

## 2016-11-08 MED ORDER — MEPERIDINE HCL 25 MG/ML IJ SOLN
12.5000 mg | INTRAMUSCULAR | Status: DC | PRN
Start: 2016-11-08 — End: 2016-11-09

## 2016-11-08 MED ORDER — OXYCODONE HCL 5 MG PO TABS
5.0000 mg | ORAL_TABLET | Freq: Once | ORAL | Status: AC | PRN
Start: 2016-11-08 — End: 2016-11-08
  Administered 2016-11-08: 5 mg via ORAL

## 2016-11-08 MED ORDER — NALBUPHINE HCL 10 MG/ML IJ SOLN
5.0000 mg | INTRAMUSCULAR | Status: DC | PRN
Start: 2016-11-08 — End: 2016-11-12

## 2016-11-08 MED ORDER — HYDRALAZINE HCL 20 MG/ML IJ SOLN
INTRAMUSCULAR | Status: AC
Start: 2016-11-08 — End: 2016-11-08
  Administered 2016-11-08: 10 mg via INTRAVENOUS
  Filled 2016-11-08: qty 1

## 2016-11-08 MED ORDER — PHENYLEPHRINE 100 MCG/ML IN NACL 0.9% IV SOSY
PREFILLED_SYRINGE | INTRAVENOUS | Status: DC | PRN
Start: 2016-11-08 — End: 2016-11-08
  Administered 2016-11-08: 50 ug via INTRAVENOUS

## 2016-11-08 MED ORDER — LACTATED RINGERS IV BOLUS
1000.0000 mL | Freq: Once | INTRAVENOUS | Status: AC
Start: 2016-11-08 — End: 2016-11-08

## 2016-11-08 MED ORDER — OXYCODONE-ACETAMINOPHEN 5-325 MG PO TABS
1.0000 | ORAL_TABLET | ORAL | Status: DC | PRN
Start: 2016-11-08 — End: 2016-11-09

## 2016-11-08 MED ORDER — NIFEDIPINE ER OSMOTIC RELEASE 30 MG PO TB24
60.0000 mg | ORAL_TABLET | Freq: Every day | ORAL | Status: DC
Start: 2016-11-08 — End: 2016-11-09
  Administered 2016-11-08 – 2016-11-09 (×2): 60 mg via ORAL
  Filled 2016-11-08 (×2): qty 2

## 2016-11-08 MED ORDER — NALBUPHINE HCL 10 MG/ML IJ SOLN
5.0000 mg | INTRAMUSCULAR | Status: DC | PRN
Start: 2016-11-08 — End: 2016-11-09

## 2016-11-08 MED ORDER — OXYCODONE HCL 5 MG PO TABS
ORAL_TABLET | ORAL | Status: AC
Start: 2016-11-08 — End: ?
  Filled 2016-11-08: qty 1

## 2016-11-08 MED ORDER — ACETAMINOPHEN 325 MG PO TABS
650.0000 mg | ORAL_TABLET | ORAL | Status: DC | PRN
Start: 2016-11-08 — End: 2016-11-09
  Filled 2016-11-08 (×6): qty 2

## 2016-11-08 MED ORDER — ACETAMINOPHEN 325 MG PO TABS
325.0000 mg | ORAL_TABLET | ORAL | Status: DC | PRN
Start: 2016-11-08 — End: 2016-11-12

## 2016-11-08 MED ORDER — OXYTOCIN-LACTATED RINGERS 30 UNIT/500ML IV SOLN
7.5000 [IU]/h | INTRAVENOUS | Status: AC
Start: 2016-11-08 — End: 2016-11-09
  Administered 2016-11-08: 7.5 [IU]/h via INTRAVENOUS

## 2016-11-08 MED ORDER — PROMETHAZINE HCL 25 MG/ML IJ SOLN
INTRAMUSCULAR | Status: DC | PRN
Start: 2016-11-08 — End: 2016-11-08
  Administered 2016-11-08: 6.25 mg via INTRAVENOUS

## 2016-11-08 MED ORDER — BUPIVACAINE IN DEXTROSE 0.75-8.25 % IT SOLN
INTRATHECAL | Status: DC | PRN
Start: 2016-11-08 — End: 2016-11-08
  Administered 2016-11-08: 2 mL via INTRASPINAL

## 2016-11-08 MED ORDER — METOCLOPRAMIDE HCL 5 MG/ML IJ SOLN
10.0000 mg | Freq: Once | INTRAMUSCULAR | Status: DC | PRN
Start: 2016-11-08 — End: 2016-11-12
  Filled 2016-11-08: qty 2

## 2016-11-08 MED ORDER — ONDANSETRON HCL 4 MG/2ML IJ SOLN
4.0000 mg | Freq: Four times a day (QID) | INTRAMUSCULAR | Status: DC | PRN
Start: 2016-11-08 — End: 2016-11-09

## 2016-11-08 MED ORDER — METHYLERGONOVINE MALEATE 0.2 MG/ML IJ SOLN
200.0000 ug | INTRAMUSCULAR | Status: DC | PRN
Start: 2016-11-08 — End: 2016-11-09

## 2016-11-08 MED ORDER — FENTANYL CITRATE (PF) 50 MCG/ML IJ SOLN (WRAP)
25.0000 ug | INTRAMUSCULAR | Status: DC | PRN
Start: 2016-11-08 — End: 2016-11-09

## 2016-11-08 MED ORDER — SOD CITRATE-CITRIC ACID 500-334 MG/5ML PO SOLN
30.0000 mL | Freq: Three times a day (TID) | ORAL | Status: DC
Start: 2016-11-08 — End: 2016-11-12
  Administered 2016-11-08: 30 mL via ORAL
  Filled 2016-11-08: qty 30

## 2016-11-08 MED ORDER — DIPHENHYDRAMINE HCL 50 MG/ML IJ SOLN
12.5000 mg | INTRAMUSCULAR | Status: DC | PRN
Start: 2016-11-08 — End: 2016-11-12

## 2016-11-08 MED ORDER — HYDROMORPHONE HCL 1 MG/ML IJ SOLN
0.5000 mg | INTRAMUSCULAR | Status: DC | PRN
Start: 2016-11-08 — End: 2016-11-09
  Filled 2016-11-08: qty 0.5

## 2016-11-08 SURGICAL SUPPLY — 27 items
CLEANER ELECTROSURGICAL TIP PENCIL (Procedure Accessories) ×1
CLEANER ELECTROSURGICAL TIP PENCIL HANDSWITCH LECTROBRASIVE (Procedure Accessories) ×1 IMPLANT
CLEANER ESURG TIP PNCL LCTRBRS STRL (Procedure Accessories) ×1
DRESSING ISLAND TELFA 4X10 (Dressing) ×2 IMPLANT
DRESSING TELFA 3X8IN STERILE (Dressing) ×2 IMPLANT
GAUZE SPONGE CURITY 12PLY 4X8 (Dressing) ×2 IMPLANT
GLOVE SURG BIOGEL NEODERM S6.5 (Glove) ×2 IMPLANT
MASTISOL VIAL 2/3CC STRL (Skin Closure) ×2 IMPLANT
MASTISOL, STR TIP VL 2/3 CC (48/BX)                   FRNDLE (Dressing) ×2 IMPLANT
PAD ELECTROSRG GRND REM W CRD (Procedure Accessories) ×2 IMPLANT
PENCIL ELECTRO PUSH BUTTON (Cautery) ×2 IMPLANT
SLEEVE CMPR MED KN LGTH KDL SCD 21- IN (Sleeve) ×2
SLEEVE COMPRESSION MEDIUM KNEE LENGTH KENDALL SEQUENTIAL OD21- IN (Sleeve) ×1 IMPLANT
SOLUTION IRR 0.9% NACL 1000ML LF STRL (Irrigation Solutions) ×1
SOLUTION IRRIGATION 0.9% SODIUM CHLORIDE (Irrigation Solutions) ×1
SOLUTION IRRIGATION 0.9% SODIUM CHLORIDE 1000 ML PLASTIC POUR BOTTLE (Irrigation Solutions) ×1 IMPLANT
STRIP SKIN CLOSURE L4 IN X W1/2 IN (Dressing) ×1
STRIP SKIN CLOSURE L4 IN X W1/2 IN REINFORCE STERI-STRIP POLYESTER (Dressing) ×1 IMPLANT
STRIP SKNCLS PLSTR STRSTRP 4X.5IN LF (Dressing) ×1
SUT MONOCRYL O CTX (Suture) ×2 IMPLANT
SUTURE CHROMIC 0 CT1 27IN (Suture) ×2 IMPLANT
SUTURE MONOCRYL 3-0 KS 27IN (Suture) ×2 IMPLANT
SUTURE PLAIN 3-0 CT1 27IN (Suture) ×2 IMPLANT
SUTURE VICRYL 0-0 CT1 (Suture) ×4 IMPLANT
WATER STERILE PLASTIC POUR BOTTLE 1000 (Irrigation Solutions) ×1
WATER STERILE PLASTIC POUR BOTTLE 1000 ML (Irrigation Solutions) ×1 IMPLANT
WATER STRL 1000ML LF PLS PR BTL (Irrigation Solutions) ×1

## 2016-11-08 NOTE — Discharge Instr - AVS First Page (Signed)
[facility]        CESAREAN SECTION POSTPARTUM INSTRUCTIONS    1. Bleeding should decline over the next 2-4 weeks to eventually cease by 6 weeks.   There may be occasional episodes of an increase, particularly with breast feeding or increased activity.  This is generally normal.  However, please notify the office if the trend of bleeding is progressively increasing.  2. Sanitary pads rather than tampons are recommended until all bleeding associated with delivery ceases.  3. Constipation after delivery is common.  This condition is generally made worse by strong pain medication, decreased activity, and your surgery.  It is recommended that the daily use of bran products, prunes or prune juice, fresh fruits and vegetables, and adequate liquid intake can diminish the discomfort.  If constipation persists  in spite of these efforts, daily use of Colace, or Metamucil is strongly recommended.  You may also use Milk of Magnesia or Miralax.  4. Healing of your incision is enhanced by good hygiene, avoidance of weight any heavier than your baby, and the utilization of either a heating pad or a hot water bottle.  If there is progressive tenderness, redness, or drainage from the incision, please call the office. If you have steristrips on your incision, please remove them in approximately 4-5 days after your discharge. You can gently remove them while showering.  5. It is recommended that you continue your vitamins and iron tablets for a minimum of 6 weeks after delivery.  If you are nursing, most pediatricians recommend continued use of vitamins until the baby is weaned.  6. If you notice an area on your breast that is red, tender or swollen associated with a fever > 100.5, degrees, please notify us.  7. Douching and sexual activity are to be avoided for 4-6 weeks.  8. You should make every effort to progressively decrease your utilization of strong pain medication (Tylenol 3, Percocet, etc.)  and transition to Ibuprofen/ Tylenol.   9. You may ascend and descend stairs at a maximum of 2-3 trips a day for the first 2 weeks.  Please take one step at a time and utilize the railing for support.  We recommend you not drive for 2 weeks after surgery though some patients may feel confident to drive after 1 week.  There is no restriction to riding in the car.  10. You may go outside at anytime after going home unless specifically restricted by your doctor.  11. You may begin light exercise at 4 weeks, however you may take short walks one week after delivery.  12. We would like to be notified of any fever greater than 100.5 F, unless associated with a cold or flu.    You may feel quite exhausted for the first few months after a delivery.  The demands of a newborn are significant.  Your recovery will be enhanced by adequate rest, good nutrition, and a lot of help from family and friends.  Enjoy!

## 2016-11-08 NOTE — PACU (Signed)
Dr Honor Junes notified of pt's B/P's 160's over 80's since Hydrolozine given.   Order for Procardia 30 mg XL PO, and okay to send to [facility] floor.   Parameters given to notify physician if Diastolic is above 90.

## 2016-11-08 NOTE — Progress Notes (Signed)
Phone report to Dr Honor Junes using SBAR. MD aware of Pt hx, complaint, and assessment. MD placing orders.

## 2016-11-08 NOTE — Anesthesia Preprocedure Evaluation (Signed)
Anesthesia Evaluation    AIRWAY    Mallampati: II    TM distance: >3 FB  Neck ROM: full  Mouth Opening:full   CARDIOVASCULAR    cardiovascular exam normal       DENTAL           PULMONARY    pulmonary exam normal     OTHER FINDINGS              Relevant Problems   No active problems are marked relevant to this note.               Anesthesia Plan    ASA 2     spinal                                 informed consent obtained      pertinent labs reviewed    Pre-eclampsia, initial BP 190's systolic.  Treated with hydralazine and procardia.  Labs reviewed.         Signed by: Marland Mcalpine 11/08/16 6:22 PM

## 2016-11-08 NOTE — Progress Notes (Signed)
FHR 146 good fetal movement, pt sent to triage for elevated BP and 3+ protein in urine.

## 2016-11-08 NOTE — Anesthesia Procedure Notes (Signed)
Spinal      Patient location during procedure: OR  Reason for block: Primary Anesthesia In the OR    Block at Surgeon's request: Yes          Staffing  Anesthesiologist: Cadan Maggart  Performed: anesthesiologist       Pre-procedure Checklist   Completed: patient identified, surgical consent, pre-op evaluation, timeout performed, risks and benefits discussed, monitors and equipment checked, anesthesia consent given and correct site      Spinal  Patient monitoring: NIBP and pulse oximetry    Premedication: No    Patient position: sitting    Sterile Technique: povidone-iodine 7.5% surgical scrub, patient draped, mask used and wearing gloves  Skin Local: lidocaine 1%    Successful attempt  Interspace: L3-4    Approach: midline  Number of attempts: 1      Needle Placement  Needle type: Whitacre tip   Needle gauge: 25              Paresthesia Pain: no    Catheter Placement   Catheter type: none  CSF Return: Yes  Blood Return: No              Assessment   Block Outcome: patient tolerated procedure well, no complications and successful block

## 2016-11-08 NOTE — Transfer of Care (Signed)
Anesthesia Transfer of Care Note    Patient: Casey Banks    Procedures performed: Procedure(s):  CESAREAN SECTION    Anesthesia type: Spinal    Patient location:Phase I PACU    Last vitals:   Vitals:    11/08/16 2016   BP: 153/72   Pulse: (!) 101   Resp: 16   Temp:    SpO2: 99%       Post pain: Patient not complaining of pain, continue current therapy      Mental Status:awake and alert     Respiratory Function: tolerating room air    Cardiovascular: stable    Nausea/Vomiting: patient not complaining of nausea or vomiting    Hydration Status: adequate    Post assessment: no apparent anesthetic complications and no evidence of recall Bilateral lung sound is clear. Report is given to PACU RN.    Signed by: Deveron Furlong  11/08/16 8:16 PM

## 2016-11-08 NOTE — Telephone Encounter (Signed)
Pt calling states she was seen at chiropractors office yesterday for neck adjustment and when she had BP taken it was 159/111. When questioned pt denies headache,visual changes, epigastric pain, or increase in swelling. Recommended pt come in for BP check and urine dip.

## 2016-11-08 NOTE — H&P (Addendum)
[facility] LABOR AND DELIVERY ADMISSION H&P    Date Time: 01/09/185:50 PM  Room#WGTR04/WGTR04.01      Chief Complaint   Patient presents with   . Hypertension       Subjective:  Casey Banks is a 31 y.o. G1 P0 female with a gestation age of [redacted]w[redacted]d and Estimated Date of Delivery: 11/24/16 who presents to the hospital for observation.  Her current obstetrical history is significant for elevated BP in her chiropractor's office yesterday.- 159/111.Patient reports nausea and heartburn.   She was seen in the office today with a BP 170/100 3+ proteinuria    Past Medical History:   Diagnosis Date   . Abnormal Pap smear of cervix    . Anemia    . Anxiety    . Depression    . Headache    . Urinary tract infection          Objective:    Vital Signs:  Temp:  [98.2 F (36.8 C)] 98.2 F (36.8 C)  Heart Rate:  [93-127] 102  Resp Rate:  [18] 18  BP: (170-186)/(97-107) 182/105      Review of Systems:   General appearance - alert, well appearing, and in no distress  Chest - clear to auscultation, no wheezes, rales or rhonchi, symmetric air entry  Heart - normal rate, regular rhythm, normal S1, S2, no murmurs, rubs, clicks or gallops  Abdomen - soft, nontender, nondistended, no masses or organomegaly  Extremities - 3+ edema      Cervical Exam:  Dilation: 1  Method: Manual  OB Examiner: Dr Honor Junes    FHT: Baseline Rate: 135 BPM      Labs:     Results     Procedure Component Value Units Date/Time    PT AND APTT [[id number]]  (Abnormal) Collected:  11/08/16 1716     Updated:  11/08/16 1747     PT 11.7 (L) sec      PT INR 0.9     PT Anticoag. Given Within 48 hrs. None     PTT 28 sec     CBC without differential [[id number]]  (Abnormal) Collected:  11/08/16 1716    Specimen:  Blood from Blood Updated:  11/08/16 1739     WBC 9.73 x10 3/uL      Hgb 10.7 (L) g/dL      Hematocrit 78.2 (L) %      Platelets 216 x10 3/uL      RBC 4.04 (L) x10 6/uL      MCV 82.7 fL      MCH 26.5 (L) pg      MCHC 32.0 g/dL      RDW 15 %      MPV 13.9 (H) fL       Nucleated RBC 0.0 /100 WBC      Absolute NRBC 0.00 x10 3/uL     Urinalysis with microscopic [[id number]] Collected:  11/08/16 1729    Specimen:  Urine Updated:  11/08/16 1738    Protein / creatinine ratio, urine [[id number]] Collected:  11/08/16 1729    Specimen:  Urine Updated:  11/08/16 1738    Has Zika Testing Been Performed [[id number]] Resulted:  11/08/16      Updated:  11/08/16 1735     ZIKA TESTING DONE Not Indicated    Creatinine, serum [[id number]] Collected:  11/08/16 1716    Specimen:  Blood Updated:  11/08/16 1729    AST [[id number]] Collected:  11/08/16 1716    Specimen:  Blood  Updated:  11/08/16 1729    ALT [[id number]] Collected:  11/08/16 1716    Specimen:  Blood Updated:  11/08/16 1729    Uric acid [[id number]] Collected:  11/08/16 1716    Specimen:  Blood Updated:  11/08/16 1729    Fibrinogen [[id number]] Collected:  11/08/16 1716    Specimen:  Blood Updated:  11/08/16 1729    Lactate dehydrogenase [[id number]] Collected:  11/08/16 1716    Specimen:  Blood Updated:  11/08/16 1729               Assessment/Plan:  Patient is a 31 y.o., G1 P0 [redacted]w[redacted]d with Preeclampsia- Hydralazine 10 mg IV administered, followed by Procardia XL 60 mg. BP 160/97. Discussed Rx. Since she has an unfavorable cervix, I recommend that we proceed with a Primary CS. I have reviewed induction as well, however the induction would be prolonged. Zofran and Bicitra administered.  Risks/complications were discussed, to include, but not limited to: bleeding, infection, hemorrhage, transfusion, bowel/bladder/ureteral damage, SBO, ileus, PE, phlebitis, wound dehiscence, etc., and as indicated on the cesarean section consent form. Will proceed    No Known Allergies    Current Facility-Administered Medications   Medication Dose Route Frequency Provider Last Rate Last Dose   . hydrALAZINE (APRESOLINE) injection 10 mg  10 mg Intravenous Q6H PRN Aaliah Jorgenson B, MD   10 mg at 11/08/16 1719   . lactated ringers bolus 1,000 mL  1,000 mL  Intravenous Once Wylee Dorantes B, MD       . lactated ringers infusion   Intravenous Continuous Sheriff Rodenberg B, MD 125 mL/hr at 11/08/16 1731     . NIFEdipine XL (PROCARDIA XL) 24 hr tablet 60 mg  60 mg Oral Daily Gaytha Raybourn B, MD   60 mg at 11/08/16 1747   . sodium citrate-citric acid (BICITRA) oral solution 30 mL  30 mL Oral TID Reegan Mctighe B, MD   30 mL at 11/08/16 1747       OB History     Gravida Para Term Preterm AB Living    1              SAB TAB Ectopic Multiple Live Births                       Active Problems:    Preeclampsia      History reviewed. No pertinent surgical history.    Obstetrical history significant for Preeclampsia    Not in labor.    Risks, benefits, alternatives and possible complications have been discussed in detail with the patient.  Pre-admission, admission, and post admission procedures and expectations were discussed in detail.  All questions answered, all appropriate consents will be signed at the Hospital.     [facility]

## 2016-11-09 ENCOUNTER — Encounter (HOSPITAL_BASED_OUTPATIENT_CLINIC_OR_DEPARTMENT_OTHER): Payer: Self-pay | Admitting: Obstetrics & Gynecology

## 2016-11-09 ENCOUNTER — Other Ambulatory Visit: Payer: Self-pay

## 2016-11-09 LAB — CBC AND DIFFERENTIAL
Absolute NRBC: 0 10*3/uL
Basophils Absolute Automated: 0.03 10*3/uL (ref 0.00–0.20)
Basophils Automated: 0.2 %
Eosinophils Absolute Automated: 0.1 10*3/uL (ref 0.00–0.70)
Eosinophils Automated: 0.8 %
Hematocrit: 26.3 % — ABNORMAL LOW (ref 37.0–47.0)
Hgb: 8.5 g/dL — ABNORMAL LOW (ref 12.0–16.0)
Immature Granulocytes Absolute: 0.06 10*3/uL — ABNORMAL HIGH
Immature Granulocytes: 0.5 %
Lymphocytes Absolute Automated: 2.04 10*3/uL (ref 0.50–4.40)
Lymphocytes Automated: 15.9 %
MCH: 26.7 pg — ABNORMAL LOW (ref 28.0–32.0)
MCHC: 32.3 g/dL (ref 32.0–36.0)
MCV: 82.7 fL (ref 80.0–100.0)
MPV: 13.1 fL — ABNORMAL HIGH (ref 9.4–12.3)
Monocytes Absolute Automated: 0.78 10*3/uL (ref 0.00–1.20)
Monocytes: 6.1 %
Neutrophils Absolute: 9.8 10*3/uL — ABNORMAL HIGH (ref 1.80–8.10)
Neutrophils: 76.5 %
Nucleated RBC: 0 /100 WBC (ref 0.0–1.0)
Platelets: 183 10*3/uL (ref 140–400)
RBC: 3.18 10*6/uL — ABNORMAL LOW (ref 4.20–5.40)
RDW: 15 % (ref 12–15)
WBC: 12.81 10*3/uL — ABNORMAL HIGH (ref 3.50–10.80)

## 2016-11-09 MED ORDER — SIMETHICONE 80 MG PO CHEW
160.0000 mg | CHEWABLE_TABLET | Freq: Three times a day (TID) | ORAL | Status: DC | PRN
Start: 2016-11-09 — End: 2016-11-12
  Administered 2016-11-09 (×2): 160 mg via ORAL
  Filled 2016-11-09 (×3): qty 2

## 2016-11-09 MED ORDER — OXYTOCIN-LACTATED RINGERS 30 UNIT/500ML IV SOLN
7.5000 [IU]/h | INTRAVENOUS | Status: AC
Start: 2016-11-09 — End: 2016-11-09

## 2016-11-09 MED ORDER — MEASLES, MUMPS & RUBELLA VAC SC INJ
0.5000 mL | INJECTION | SUBCUTANEOUS | Status: DC | PRN
Start: 2016-11-09 — End: 2016-11-12

## 2016-11-09 MED ORDER — ONDANSETRON 4 MG PO TBDP
4.0000 mg | ORAL_TABLET | Freq: Four times a day (QID) | ORAL | Status: DC | PRN
Start: 2016-11-09 — End: 2016-11-12

## 2016-11-09 MED ORDER — LANOLIN EX OINT
TOPICAL_OINTMENT | CUTANEOUS | Status: DC | PRN
Start: 2016-11-09 — End: 2016-11-12
  Filled 2016-11-09: qty 7

## 2016-11-09 MED ORDER — METHYLERGONOVINE MALEATE 0.2 MG/ML IJ SOLN
200.0000 ug | Freq: Once | INTRAMUSCULAR | Status: DC | PRN
Start: 2016-11-09 — End: 2016-11-12

## 2016-11-09 MED ORDER — ONDANSETRON HCL 4 MG/2ML IJ SOLN
4.0000 mg | Freq: Four times a day (QID) | INTRAMUSCULAR | Status: DC | PRN
Start: 2016-11-09 — End: 2016-11-12

## 2016-11-09 MED ORDER — LACTATED RINGERS IV SOLN
INTRAVENOUS | Status: DC
Start: 2016-11-09 — End: 2016-11-12

## 2016-11-09 MED ORDER — TETANUS-DIPHTH-ACELL PERTUSSIS 5-2.5-18.5 LF-MCG/0.5 IM SUSP
0.5000 mL | INTRAMUSCULAR | Status: DC | PRN
Start: 2016-11-09 — End: 2016-11-12

## 2016-11-09 MED ORDER — SODIUM CHLORIDE 0.9 % IJ SOLN
3.0000 mL | Freq: Three times a day (TID) | INTRAMUSCULAR | Status: DC
Start: 2016-11-10 — End: 2016-11-12

## 2016-11-09 MED ORDER — HYDROMORPHONE HCL 1 MG/ML IJ SOLN
0.5000 mg | INTRAMUSCULAR | Status: DC | PRN
Start: 2016-11-09 — End: 2016-11-12

## 2016-11-09 MED ORDER — ACETAMINOPHEN 650 MG RE SUPP
650.0000 mg | Freq: Four times a day (QID) | RECTAL | Status: DC | PRN
Start: 2016-11-09 — End: 2016-11-12

## 2016-11-09 MED ORDER — PRENATAL AD PO TABS
1.0000 | ORAL_TABLET | Freq: Every day | ORAL | Status: DC
Start: 2016-11-09 — End: 2016-11-12
  Administered 2016-11-09 – 2016-11-11 (×2): 1 via ORAL
  Filled 2016-11-09 (×2): qty 1

## 2016-11-09 MED ORDER — MISOPROSTOL 200 MCG PO TABS
800.0000 ug | ORAL_TABLET | Freq: Once | ORAL | Status: DC | PRN
Start: 2016-11-09 — End: 2016-11-12

## 2016-11-09 MED ORDER — METHYLERGONOVINE MALEATE 0.2 MG/ML IJ SOLN
200.0000 ug | INTRAMUSCULAR | Status: DC | PRN
Start: 2016-11-09 — End: 2016-11-12

## 2016-11-09 MED ORDER — OXYCODONE-ACETAMINOPHEN 5-325 MG PO TABS
1.0000 | ORAL_TABLET | ORAL | Status: DC | PRN
Start: 2016-11-09 — End: 2016-11-12
  Administered 2016-11-09 – 2016-11-12 (×15): 1 via ORAL
  Filled 2016-11-09 (×16): qty 1

## 2016-11-09 MED ORDER — DOCUSATE SODIUM 100 MG PO CAPS
200.0000 mg | ORAL_CAPSULE | Freq: Two times a day (BID) | ORAL | Status: DC | PRN
Start: 2016-11-09 — End: 2016-11-12
  Administered 2016-11-09 – 2016-11-12 (×6): 200 mg via ORAL
  Filled 2016-11-09 (×5): qty 2

## 2016-11-09 MED ORDER — IBUPROFEN 600 MG PO TABS
600.0000 mg | ORAL_TABLET | Freq: Four times a day (QID) | ORAL | Status: DC
Start: 2016-11-09 — End: 2016-11-12
  Administered 2016-11-09 – 2016-11-10 (×8): 600 mg via ORAL
  Filled 2016-11-09 (×8): qty 1

## 2016-11-09 MED ORDER — LACTATED RINGERS IV SOLN
125.0000 mL/h | INTRAVENOUS | Status: AC
Start: 2016-11-09 — End: 2016-11-10

## 2016-11-09 MED ORDER — BISACODYL 10 MG RE SUPP
10.0000 mg | Freq: Every day | RECTAL | Status: DC | PRN
Start: 2016-11-09 — End: 2016-11-12

## 2016-11-09 MED ORDER — OXYCODONE-ACETAMINOPHEN 5-325 MG PO TABS
1.0000 | ORAL_TABLET | ORAL | Status: DC | PRN
Start: 2016-11-09 — End: 2016-11-11
  Administered 2016-11-11 (×2): 1 via ORAL

## 2016-11-09 MED ORDER — ACETAMINOPHEN 325 MG PO TABS
650.0000 mg | ORAL_TABLET | Freq: Four times a day (QID) | ORAL | Status: DC | PRN
Start: 2016-11-09 — End: 2016-11-12

## 2016-11-09 MED ORDER — FENTANYL CITRATE (PF) 50 MCG/ML IJ SOLN (WRAP)
25.0000 ug | INTRAMUSCULAR | Status: DC | PRN
Start: 2016-11-09 — End: 2016-11-12

## 2016-11-09 NOTE — Plan of Care (Signed)
Patient is stable and without questions or concerns at this time.  POC reviewed this shift and patient verbalizes understanding and continues to progress.    Patient pain level within patient stated goal: Yes  Patient breastfeeding infant: Yes  Patient pumping: No  Patient lochia appropriate: Yes, describe rubra scant  Foley out and voiding: Yes. Foley was removed and patient voided twice for measurements.  Abdominal incision clean, dry and well approximated: Yes  Nutrition adequate for discharge: Yes  Patient reports constipation: No   Patient agrees to notify RN of any changes in her condition/status: Yes

## 2016-11-09 NOTE — Progress Notes (Signed)
Reevaluated pt VS with [facility] nurse. Pt VS stable and heart rate. Will continue to monitor pt and update day RN.

## 2016-11-09 NOTE — Plan of Care (Signed)
Admission from [facility] PACU Via  Stretcher. Patient VS stable, physical assessment, lochia WNLs. Reviewed admission per [facility] protocol. POC and pain management reviewed at bedside and addressed pt if there are any questions. Safety and security reviewed and patient verbalized understanding. Mother is initiating breast feeding and skin to skin. Mother is bonding appropriately with baby, and is due to be assessed four hours from admission.

## 2016-11-09 NOTE — Progress Notes (Signed)
Anesthesia Department Obstetric Follow-up    Patient: Casey Banks    Anesthesia type: Spinal    Post-operative pain managment: Subarachnoid Morphine    Complaints/Side Effects:  None    Last vitals:   Vitals:    11/09/16 0948   BP: 125/75   Pulse: (!) 103   Resp:    Temp:    SpO2:         Mental Status:awake and alert     Pain (assessment and plan):  Patient not complaining of pain, continue current therapy     Respiratory Function: tolerating room air    Nausea/Vomiting: patient not complaining of nausea or vomiting    Post assessment: no apparent anesthetic complications and no reportable events

## 2016-11-09 NOTE — Lactation Note (Signed)
Gravida: 1    Para:  1          Delivery type:   c/s  DOB and delivery time: 11/08/16     @   1928  Gestational age:  32+5    Mother is strictly breastfeeding. I helped the baby to latch on the right side in the football position and the baby latched on well and breast fed well. He breast fed for 20 minutes on the right side. I, then, put him on the left breast, but he was too sleepy at that point. The baby has had one void and 2 stools since birth. Mom's right breast is bigger than the left but she said she has noticed both of her breasts got bigger during her pregnancy.     Encouraged the patient to breast feed the baby on demand and at least 8-12 times a day, and to avoid formula supplementation unless it is medically necessary. I also explained to the parents to monitor the baby's output and weight to make sure the baby is not getting dehydrated and gaining weight appropriately.    I wrote my name and number on the board and told mom to call me if she needs assistance or for any other problems, questions, or concerns.     I also gave mom an outpatient breastfeeding resource sheet for follow up after discharge if needed.    Will follow up tomorrow and as needed per patient request.

## 2016-11-09 NOTE — Progress Notes (Signed)
Received report from [place], RN from PACU. To notify MD if systolic pressure >90. Notified [facility] nurse. To address BP at time of arrival for patient per [facility] protocol, and anticipated BP admission will be  elevated, and will reassess in an hour.

## 2016-11-09 NOTE — Plan of Care (Signed)
Lactation consult completed.  POC reviewed, patient verbalizes understanding and continues to progress.    Patient exclusively breastfeeding infant: yes  Patient feeding formula:no  Patient pumping: no

## 2016-11-09 NOTE — Progress Notes (Signed)
Post-Operative Day #1    S:  Doing well.  Tolerating clears.  Pain controlled.  Lochia decreasing.  +Flatus.      O:        VS: Patient Vitals for the past 24 hrs:   BP Temp Temp src Pulse Resp SpO2 Height Weight   11/09/16 0948 125/75 - - (!) 103 - - - -   11/09/16 0522 136/81 - - 99 18 95 % - -   11/09/16 0239 133/78 98.1 F (36.7 C) Oral (!) 112 18 95 % - -   11/09/16 0137 (!) 153/94 97.9 F (36.6 C) Oral (!) 102 18 95 % - -   11/09/16 0122 (!) 153/95 97.9 F (36.6 C) Oral (!) 105 18 97 % - -   11/09/16 0100 170/80 - - (!) 107 - 98 % - -   11/09/16 0045 171/87 97.9 F (36.6 C) Oral (!) 101 - 99 % - -   11/09/16 0015 167/86 - - (!) 103 - 98 % - -   11/09/16 0001 157/82 - - (!) 101 - 99 % - -   11/08/16 2331 163/86 - - 100 - 99 % - -   11/08/16 2330 167/82 - - (!) 102 - 99 % - -   11/08/16 2315 166/88 98.1 F (36.7 C) Oral 99 - 99 % - -   11/08/16 2300 (!) 169/92 - - 89 - 99 % - -   11/08/16 2245 178/89 - - 85 - 99 % - -   11/08/16 2230 183/89 - - 79 - 100 % - -   11/08/16 2222 180/85 - - 77 - 99 % - -   11/08/16 2201 163/87 - - 77 - 99 % - -   11/08/16 2145 162/85 - - - 16 - - -   11/08/16 2130 - - - 81 16 99 % - -   11/08/16 2115 116/73 - - 77 16 100 % - -   11/08/16 2100 139/67 - - 79 16 100 % - -   11/08/16 2045 133/60 - - 85 16 100 % - -   11/08/16 2030 141/70 - - 91 16 99 % - -   11/08/16 2016 153/72 97.6 F (36.4 C) - (!) 101 16 99 % - -   11/08/16 2015 153/72 97.6 F (36.4 C) Oral (!) 102 16 98 % - -   11/08/16 1835 178/85 - - 97 - - - -   11/08/16 1821 (!) 143/92 - - 96 - - - -   11/08/16 1805 145/82 - - 100 - - - -   11/08/16 1750 (!) 176/97 - - (!) 102 - - - -   11/08/16 1748 - - - - - - 1.6 m (5\' 3" ) 101.2 kg (223 lb)   11/08/16 1745 (!) 182/105 - - (!) 127 - - - -   11/08/16 1734 (!) 175/97 - - 96 - - - -   11/08/16 1726 (!) 183/107 - - (!) 104 - - - -   11/08/16 1704 (!) 186/103 98.2 F (36.8 C) Oral 93 18 - - -        Recent Labs  Lab 11/09/16  0617   WBC 12.81*   Hgb 8.5*   Hematocrit 26.3*    Platelets 183     General: no acute distress  Abd: soft,  appropriately tender, fundus firm below the umbilicus; + BS throughout, bleeding scant.   Incision: dressing removed; clean/dry/intact  Ext: No edema, cords or tenderness     A/P:  -  Casey Banks is a 31 y.o.  G1P1001 Postpartum Day #1 S/P primary cesarean delivery.   -  Routine post-partum care  -  Circumcision done    -  Anticipate discharge to home 1-2 days   - Will see how BPs do today and will decide re Procardia and dosage later this afternoon.

## 2016-11-09 NOTE — Anesthesia Postprocedure Evaluation (Signed)
Anesthesia Post Evaluation    Patient: Casey Banks    Procedure(s):  CESAREAN SECTION    Anesthesia type: Spinal    Last Vitals:   Vitals:    11/09/16 0948   BP: 125/75   Pulse: (!) 103   Resp:    Temp:    SpO2:        Patient Location: [facility]      Post Pain: Patient not complaining of pain, continue current therapy    Mental Status: awake and alert    Respiratory Function: tolerating room air    Cardiovascular: stable    Nausea/Vomiting: patient not complaining of nausea or vomiting    Hydration Status: adequate    Post Assessment: no apparent anesthetic complications and no reportable events          Not Complete    Signed by: Henreitta Leber, 11/09/2016 11:15 AM

## 2016-11-09 NOTE — Progress Notes (Signed)
The pt's BPs have remained stable all day not on any medication: 122-136/71-82 for the past 24 hours. We will not start any Procardia at this time and will just continue to closely follow BPs.

## 2016-11-10 ENCOUNTER — Other Ambulatory Visit: Payer: Self-pay

## 2016-11-10 DIAGNOSIS — O1490 Unspecified pre-eclampsia, unspecified trimester: Secondary | ICD-10-CM

## 2016-11-10 LAB — GFR: EGFR: >60

## 2016-11-10 LAB — PREGNANCY INDUCED HYPERTENSION PANEL
ALT: 27 U/L (ref 0–55)
AST (SGOT): 29 U/L (ref 5–34)
Creatinine: 0.7 mg/dL (ref 0.6–1.0)
LDH: 252 U/L (ref 125–331)
Uric acid: 4.2 mg/dL (ref 2.6–6.0)

## 2016-11-10 MED ORDER — LABETALOL HCL 200 MG PO TABS
200.0000 mg | ORAL_TABLET | Freq: Two times a day (BID) | ORAL | Status: DC
Start: 2016-11-10 — End: 2016-11-11
  Administered 2016-11-10 – 2016-11-11 (×2): 200 mg via ORAL
  Filled 2016-11-10: qty 1

## 2016-11-10 MED ORDER — OXYCODONE-ACETAMINOPHEN 5-325 MG PO TABS
ORAL_TABLET | ORAL | 0 refills | Status: AC
Start: 2016-11-10 — End: ?
  Filled 2016-11-10: qty 30, 2d supply, fill #0

## 2016-11-10 MED ORDER — CYCLOBENZAPRINE HCL 10 MG PO TABS
10.0000 mg | ORAL_TABLET | Freq: Three times a day (TID) | ORAL | Status: DC | PRN
Start: 2016-11-10 — End: 2016-11-12
  Administered 2016-11-10 – 2016-11-12 (×5): 10 mg via ORAL
  Filled 2016-11-10 (×7): qty 1

## 2016-11-10 NOTE — Plan of Care (Signed)
MD notified for pt elevated BP, no further orders at this time expect for continued monitoring for BP.

## 2016-11-10 NOTE — Plan of Care (Signed)
Patient is stable without questions or concerns. POC reviewed this shift and patient verbalizes understanding and continues to progress.    Patient pain level within patient stated goals: Yes  Patient is breastfeeding infant: Yes  Patient is pumping: No  Patient's lochia appropriate: Yes  Patient voiding without difficulty: Yes  Abdominal incision clean, dry, and well approximated:Yes  Nutrition adequate for discharge:Yes  Patient reports constipation: No  Narcotic side effects of constipations and drowsiness reviewed: Yes  Falls education for mother completed: Yes  Hourly rounding: Yes  Newborn Fall education to patient completed: Yes    Patient agrees to notify RN of any changes in her condition and status.

## 2016-11-10 NOTE — Progress Notes (Signed)
Pt VSS stable throughout PM shift. BP and HR WNL. Pt states pain managed well with pain meds ibuprofen and percocet. Lochia WNL, denies pain upon urination.   Ambulates as tolerated.  Will continue to monitor pt.

## 2016-11-10 NOTE — Lactation Note (Signed)
I followed up with the mother to offer additional assistance with bf. The mother was bf the baby STS on the right side in the [facility] when I arrived. I observed a shallow latch. I asked the mother if I could detach the baby. The mother agreed and I assisted the mother with repositioning the baby in a better position. The mother said the latch felt better.   I recommended the mother give the baby more space for the chin to touch the breast by moving her hand back by the chest wall.  I instructed in breast compression and how to include with bf to help increase the milk flow.  I reviewed breastfeeding basics ;              Encouraged  STS, watch for feeding cues (hands to the mouth and mouth moving), encouraged frequent effective deep latch at least g 3 hours.  Signs of effective feeding discussed.                   Watch for swallowing,movement back to ears, cheeks puffing out.  I reinforced the importance of monitoring wet diapers, stools and weight for the effectiveness of the feeding.  Contact number given for f/u help if desires.

## 2016-11-10 NOTE — Plan of Care (Signed)
Patient's vital signs stable, physical assessment and lochia WNLs. POC reviewed for night shift and pt verbalizes understanding.  No questions/concerns present at this time. Postpartum and newborn care goals progressing. Pain is being managed well with current orders. Side effects reviewed with patient. Mom encouraged to feed baby on demand or every 2-3 hours. Pt instructed on how to hand express breast milk. Patient verbalizes understanding about safety protocols and fall risk. Patient instructed to not sleep with the baby in the bed and to place the baby in the bassinet when feeling sleepy. Patient verbalizes understanding when taught Postpartum/Newborn education. Will continue to monitor patient's status. Patient continues to progress.

## 2016-11-10 NOTE — Progress Notes (Signed)
Post-Operative Day # 2    S:  Doing well.  Tolerating regular diet.  Voiding spontaneously without difficulty. Pain controlled.  Lochia decreasing.  +Flatus. Ambulating well.     O:   VS: Patient Vitals for the past 24 hrs:   BP Temp Temp src Pulse Resp SpO2   11/10/16 0521 130/78 97.7 F (36.5 C) Oral 89 16 95 %   11/09/16 2028 134/80 98.2 F (36.8 C) Oral 97 16 97 %   11/09/16 1535 134/82 98.1 F (36.7 C) Oral 97 18 96 %      General: no acute distress   CVS: RRR   Pulm: Clear, bilaterally   Abd: soft, non-tender, fundus firm below the umbilicus   Incision: clean/dry/intact    Ext: No edema, cords or tenderness     A/P:  Casey Banks is a 31 y.o. G1P1001 Postpartum Day #2 S/P primary cesarean delivery for Preeclampsia  - routine post-partum care  - script given for pain control at home (percocet)  - anticipate discharge to home tomorrow   - will monitor BP- no meds for now

## 2016-11-10 NOTE — Plan of Care (Signed)
Mother's breastfeeding Plan of Care.  Lactation consultation completed. POC reviewed with the mother.  The mother verbalizes understanding and continues to progress.  Mother is exclusively breastfeeding her baby: Yes   Mother is feeding formula: no  Mother is pumping: no  Mother supplementing: no

## 2016-11-11 ENCOUNTER — Ambulatory Visit: Payer: No Typology Code available for payment source | Admitting: Obstetrics and Gynecology

## 2016-11-11 MED ORDER — LABETALOL HCL 100 MG PO TABS
ORAL_TABLET | ORAL | Status: AC
Start: 2016-11-11 — End: 2016-11-11
  Filled 2016-11-11: qty 1

## 2016-11-11 MED ORDER — FERROUS SULFATE 324 (65 FE) MG PO TBEC
324.0000 mg | DELAYED_RELEASE_TABLET | Freq: Every morning | ORAL | Status: DC
Start: 2016-11-11 — End: 2016-11-12
  Administered 2016-11-11 – 2016-11-12 (×2): 324 mg via ORAL
  Filled 2016-11-11: qty 1

## 2016-11-11 MED ORDER — OXYCODONE-ACETAMINOPHEN 5-325 MG PO TABS
ORAL_TABLET | ORAL | Status: AC
Start: 2016-11-11 — End: 2016-11-11
  Filled 2016-11-11: qty 1

## 2016-11-11 MED ORDER — OXYCODONE-ACETAMINOPHEN 5-325 MG PO TABS
ORAL_TABLET | ORAL | Status: AC
Start: 2016-11-11 — End: 2016-11-12
  Filled 2016-11-11: qty 1

## 2016-11-11 MED ORDER — DOCUSATE SODIUM 100 MG PO CAPS
ORAL_CAPSULE | ORAL | Status: AC
Start: 2016-11-11 — End: 2016-11-11
  Filled 2016-11-11: qty 2

## 2016-11-11 MED ORDER — PRENATAL AD PO TABS
ORAL_TABLET | ORAL | Status: AC
Start: 2016-11-11 — End: 2016-11-11
  Filled 2016-11-11: qty 1

## 2016-11-11 MED ORDER — FERROUS SULFATE 324 (65 FE) MG PO TBEC
DELAYED_RELEASE_TABLET | ORAL | Status: AC
Start: 2016-11-11 — End: 2016-11-11
  Filled 2016-11-11: qty 1

## 2016-11-11 MED ORDER — LABETALOL HCL 200 MG PO TABS
ORAL_TABLET | ORAL | Status: AC
Start: 2016-11-11 — End: 2016-11-11
  Filled 2016-11-11: qty 1

## 2016-11-11 MED ORDER — LABETALOL HCL 100 MG PO TABS
100.0000 mg | ORAL_TABLET | Freq: Once | ORAL | Status: AC
Start: 2016-11-11 — End: 2016-11-11
  Administered 2016-11-11: 100 mg via ORAL

## 2016-11-11 MED ORDER — LABETALOL HCL 200 MG PO TABS
300.0000 mg | ORAL_TABLET | Freq: Three times a day (TID) | ORAL | Status: DC
Start: 2016-11-11 — End: 2016-11-12
  Administered 2016-11-11 – 2016-11-12 (×3): 300 mg via ORAL
  Filled 2016-11-11 (×6): qty 1

## 2016-11-11 MED ORDER — IBUPROFEN 600 MG PO TABS
ORAL_TABLET | ORAL | Status: AC
Start: 2016-11-11 — End: 2016-11-11
  Filled 2016-11-11: qty 1

## 2016-11-11 NOTE — Progress Notes (Signed)
CNM at bedside. This RN provided heating pad for pt. Will continue to monitor.

## 2016-11-11 NOTE — Plan of Care (Signed)
VSS- BP WNL. MD aware. Pt continues to c/o severe neck pain. Pt insists neck pain associated with 140/80 blood pressure. Pt reports she experienced the same pain before her delivery. [place] CNM notified at this time- reports BP WNL and will discuss further pain med options with anesthesia. Pt reports small relief with percocet and flexeril. Refusing ibuprofen at this time, reports it will raise her blood pressure. Voiding adequately. Passing flatus. No needs at this time. Will continue to monitor. Call light in reach. Pt mother at bedside.

## 2016-11-11 NOTE — Progress Notes (Signed)
Bp elevated. Rechecked one hour later, BP WNL after scheduled labatelol dose. Pt reports Neck pain much better 5/10. Incision pain moderate with movement.  Pt mother at bedside. In good spirits. No needs at this time. Plans for d/c tomorrow. Will continue to monitor.

## 2016-11-11 NOTE — Progress Notes (Signed)
Nurse called on-call MD, Dr. Reita May, and reported pt's BP of 175/104 and that pt. Is complaining of increasing neck pain. Pt. Denies nausea, vomiting, vision disturbances, dizziness or lightheadednes. Dr. Reita May ordered [facility] labs, Labetolol q12 hours (hold med if BP is <140/90) and vitals q4 hours.

## 2016-11-11 NOTE — Plan of Care (Signed)
Patient is stable and without questions or concerns at this time.  POC and safety information reviewed this shift and patient verbalizes understanding and continues to progress.    Patient pain level within patient stated goal: Yes  Patient breastfeeding infant: Yes  Patient pumping: No  Patient lochia appropriate: Yes, rubra/scant  Foley out and voiding: Yes  Abdominal incision clean, dry and well approximated: Yes  Nutrition adequate for discharge: Yes  Patient reports constipation: No   Patient agrees to notify RN of any changes in her condition/status: Yes

## 2016-11-11 NOTE — Progress Notes (Addendum)
Post-Operative Day #3    S:  Doing well.  Tolerating regular diet.  Voiding without difficulty.  Pain controlled.  +Flatus.  Lochia decreasing.   Patient still c/o of a headache in the base of neck. BPs have been elevated and she was started on labetalol 200 mg BID last night.     O:     VS: Patient Vitals for the past 24 hrs:   BP Temp Temp src Pulse Resp SpO2   11/11/16 0910 148/89 - - - - -   11/11/16 0907 (!) 144/92 - - 95 - 100 %   11/11/16 0600 (!) 148/93 98.3 F (36.8 C) - 71 18 96 %   11/10/16 2250 145/90 - - (!) 102 - 97 %   11/10/16 2143 (!) 175/104 - - (!) 103 18 100 %   11/10/16 2007 (!) 171/109 - - - - -   11/10/16 1840 (!) 163/107 - - - - -   11/10/16 1524 146/82 97.9 F (36.6 C) Oral 82 - 99 %     .Marland Kitchen    Recent Labs  Lab 11/09/16  0617   WBC 12.81*   Hgb 8.5*   Hematocrit 26.3*   Platelets 183        General: no acute distress  Abd: soft, non-tender, fundus firm below the umbilicus  Incision: clean/dry/intact  Ext: No edema, cords or tenderness     A/P:   -  Casey Banks is a 31 y.o. G1P1001 Postpartum Day #3 S/P primary cesarean delivery.  - Spoke with Dr. Sharlett Iles, will increased her labetalol to 300 mg TID and monitor today/tonight   - Ferrous sulfate 325 mg ordered as H&H is a tad low.   - Routine post-partum care  - Anticipate discharge to home tomorrow  - Discharge instructions to be provided upon discharge

## 2016-11-11 NOTE — Progress Notes (Signed)
Patient still complaining of neck pain on the left side of her neck and she is unable to turn her head to that direction. It started suddenly last night and at that time her BPs were elevated again. She was having this same neck pain for a couple days prior to delivery and was seeing a [facility]. The neck pain subsided over the last few days after she delivered then started again last night.   She is requesting more BP medication since she thinks the two are related.  Her labetalol was increased to 300 mg TID this AM and her last BP was 141/84. I  spoke with her at length and discussed how we don't treat pain (headache, neck pains etc.) with BP medications. We treat elevated BPs with antihypertensive.  I conducted a neurologic exam and finding were all WNL. Exam only showed limited ROM when attempting to turn her head to the left. She took a flexeril which helped the pain somewhat, She has been declining ibuprofen for fear it will raise her BP and also declined the nurses recommendation of a heat pack. I spoke with both Dr. Reita May and Dr. Damaris Schooner (anesthesia) who both recommended she take the motrin and try the heat pack and can continue with the flexeril. Her neck pain sounds muscular/skeletal in nature (muscle strain), especially in the light of her limited ROM. I informed the patient of the recommendations from Dr. Reita May. She will try a heat pack for now and see if that helps.     Will continue to monitor as she is not being discharged until tomorrow.

## 2016-11-12 NOTE — Plan of Care (Signed)
Discharge home with baby.  Discharge video and discharge teaching completed.  Patient verbally acknowledged proper understanding of teaching and follow up care. All questions answered. Patient taken by wheelchair to [facility] lobby by volunteers, alongside husband and baby.

## 2016-11-12 NOTE — Progress Notes (Signed)
Post-Operative Day #4    S:  Doing better today. Neck pain is improved. Will apply heat at home. Tolerating regular diet.  Voiding without difficulty.  Pain controlled.  +Flatus.  Lochia decreasing.       O:     VS: Patient Vitals for the past 24 hrs:   BP Temp Temp src Pulse Resp SpO2   11/12/16 1055 (!) 148/94 98 F (36.7 C) - 92 18 99 %   11/12/16 0647 138/89 97.3 F (36.3 C) Oral 80 17 99 %   11/12/16 0500 132/84 98.1 F (36.7 C) Oral 74 18 100 %   11/11/16 2245 145/90 98.4 F (36.9 C) Oral 84 18 100 %   11/11/16 1745 129/84 98.2 F (36.8 C) Oral 97 18 100 %   11/11/16 1717 (!) 164/92 - - - - -   11/11/16 1716 (!) 158/94 - - - - -        General: no acute distress  Abd: soft, non-tender, fundus firm below the umbilicus  Incision: clean/dry/intact  Ext: No edema, cords or tenderness     A/P:   -  Anaria Colombe is a 31 y.o. G1P1001 Postpartum Day #3 S/P primary cesarean delivery for PEC.   - Routine post-partum care  - Discharge to home today. RTO in 5 days for recheck BP.  - Discharge instructions to be provided upon discharge    - Continue Labetalol 300 mg po tid. The pt knows to call with any concerns.   - Flexeril 10 mg po every 8 hours as needed.

## 2016-11-12 NOTE — Plan of Care (Signed)
Patient is stable without questions or concerns. POC reviewed this shift and patient verbalizes understanding and continues to progress.    Patient pain level within patient stated goals: Yes  Patient is breastfeeding infant: Yes  Patient is pumping:No  Patient's lochia appropriate:  Yes  Patient's foley removed and voiding: Yes  Abdominal incision clean, dry and well approximated:  Yes  Nutrition adequate for discharge: Yes  Patient reports constipation:  No  Narcotic side effects of constipations and drowsiness reviewed:  Yes  Falls education for mother completed:  Yes  Hourly rounding:  Yes  Newborn Fall education to patient completed:  Yes    Patient agrees to notify RN of any changes in her condition and status.    White board updated. Call bell within reach.    Baby safety and fall precautions reviewed with pt and pt's husband. All questions answered.

## 2016-11-12 NOTE — Discharge Summary (Signed)
This patient underwent a primary c-section on 11/09/2015 and had an uncomplicated post-op course. She is tolerating a regular diet, ambulating without difficulty, and her incision is healing well. She has been given a post-op discharge instruction sheet and will make a six week post-op appointment with our office.

## 2016-11-17 ENCOUNTER — Ambulatory Visit: Payer: No Typology Code available for payment source | Admitting: Obstetrics & Gynecology

## 2016-11-17 ENCOUNTER — Ambulatory Visit: Payer: Commercial Managed Care - POS | Admitting: Obstetrics & Gynecology

## 2016-11-17 VITALS — BP 110/78

## 2016-11-17 DIAGNOSIS — O1493 Unspecified pre-eclampsia, third trimester: Secondary | ICD-10-CM

## 2016-11-17 NOTE — Progress Notes (Signed)
BP 110/78 Comment: Took medication at 10 am  LMP 02/18/2016   Breastfeeding? Yes   I spoke to the pt when she came in for a BP check 5 days postpartum. The pt feels really good without any more neck pain and her swelling is almost gone. She has no headache, lightheadedness, or other complaints. The pt remains on Labetalol 300 mg po tid. She will continue on the present dose and we will reevaluate her BP, etc at her 6 week postpartum visit. The pt knows to call sooner if any concerns/problems arise.

## 2016-11-17 NOTE — Progress Notes (Signed)
Pt here for PP BP check.  Pt states she took medication at 10 am and takes it TID. Pt feels great swelling decreased. Dr Durenda Age told pt to rto at [facility].

## 2016-11-24 ENCOUNTER — Ambulatory Visit: Payer: No Typology Code available for payment source | Admitting: Obstetrics & Gynecology

## 2016-11-30 ENCOUNTER — Telehealth: Payer: Self-pay

## 2016-11-30 ENCOUNTER — Encounter: Payer: Self-pay | Admitting: Nurse Practitioner

## 2016-11-30 NOTE — Telephone Encounter (Signed)
Called pt for PP check. Left message for her to call the office with any concerns and to make a 6 week PP appt at her convenience.

## 2016-12-01 ENCOUNTER — Encounter: Payer: Self-pay | Admitting: Nurse Practitioner

## 2016-12-30 ENCOUNTER — Ambulatory Visit: Payer: Commercial Managed Care - POS | Admitting: Obstetrics & Gynecology

## 2017-01-11 ENCOUNTER — Ambulatory Visit: Payer: Commercial Managed Care - POS | Admitting: Obstetrics & Gynecology

## 2017-01-11 MED ORDER — NORETHINDRONE 0.35 MG PO TABS
1.0000 | ORAL_TABLET | Freq: Every day | ORAL | 3 refills | Status: AC
Start: 2017-01-11 — End: ?

## 2017-01-18 NOTE — Progress Notes (Signed)
Ms. Hamler is a 31 y.o. G44P1001 female status post Cesarean section for Preeclampsia    Condition:  Doing well.  Baby boy,   Breastfeeding: Yes    Current Outpatient Prescriptions   Medication Sig Dispense Refill   . acetaminophen (TYLENOL) 500 MG tablet Take 1,000 mg by mouth.     . ferrous sulfate 325 (65 FE) MG tablet Take 325 mg by mouth every morning with breakfast.     . norethindrone (ORTHO MICRONOR) 0.35 MG tablet Take 1 tablet (0.35 mg total) by mouth daily. 90 tablet 3   . oxyCODONE-acetaminophen (PERCOCET) 5-325 MG per tablet take 1-2 tablets by mouth every 4-6 hours as needed for pain 30 tablet 0   . Prenatal MV-Min-Fe Fum-FA-DHA (PRENATAL 1 PO) Take by mouth.       No current facility-administered medications for this visit.        Postpartum depression:  No  Complaints:  No  Lochia: none     PHYSICAL EXAM:  Vitals:    01/11/17 1254   BP: 118/70     Abdomen:  soft, nontender, nondistended, no masses or organomegaly  Incision: healing well  Uterus: normal size, well involuted, firm, non-tender  Adnexae: no masses palpable and nontender    ASSESSMENT:  Normal postpartum exam.    PLAN:  See orders and patient instructions  Micronor- Discussed use, side effects etc    FOLLOW UP:  6 mos    Starleen Blue, MD

## 2018-06-06 ENCOUNTER — Encounter: Payer: Self-pay | Admitting: Family Medicine

## 2018-06-06 ENCOUNTER — Ambulatory Visit: Payer: Commercial Managed Care - PPO | Admitting: Family Medicine

## 2018-06-06 VITALS — BP 130/85 | HR 81 | Temp 98.0°F | Resp 20 | Ht 64.5 in | Wt 198.2 lb

## 2018-06-06 DIAGNOSIS — F988 Other specified behavioral and emotional disorders with onset usually occurring in childhood and adolescence: Secondary | ICD-10-CM | POA: Diagnosis not present

## 2018-06-06 DIAGNOSIS — Z79899 Other long term (current) drug therapy: Secondary | ICD-10-CM

## 2018-06-06 DIAGNOSIS — Z0000 Encounter for general adult medical examination without abnormal findings: Secondary | ICD-10-CM

## 2018-06-06 MED ORDER — AMPHETAMINE-DEXTROAMPHET ER 20 MG PO CP24: 20.0000 mg | ORAL_CAPSULE | ORAL | 0 refills | Status: DC

## 2018-06-06 NOTE — Patient Instructions (Signed)
It was pleasure to meet you today.  Follow every 3 months for ADD. Check with pharmacy and insurance to see if you are allowed 90 day scripts of this medication.  Schedule you physical within next few months (before next ADD visit). These visits must be separate.    Please help [place] help you:  We are honored you have chosen [facility] for your Primary Care home. Below you will find basic instructions that you may need to access in the future. Please help [place] help you by reading the instructions, which cover many of the frequent questions we experience.   Prescription refills and request:  -In order to allow more efficient response time, please call your pharmacy for all refills. They will forward the request electronically to [place]. This allows for the quickest possible response. Request left on a nurse line can take longer to refill, since these are checked as time allows between office patients and other phone calls.  - refill request can take up to 3-5 working days to complete.  - If request is sent electronically and request is appropiate, it is usually completed in 1-2 business days.  - all patients will need to be seen routinely for all chronic medical conditions requiring prescription medications (see follow-up below). If you are overdue for follow up on your condition, you will be asked to make an appointment and we will call in enough medication to cover you until your appointment (up to 30 days).  - all controlled substances will require a face to face visit to request/refill.  - if you desire your prescriptions to go through a new pharmacy, and have an active script at original pharmacy, you will need to call your pharmacy and have scripts transferred to new pharmacy. This is completed between the pharmacy locations and not by your provider.    Results: If any images or labs were ordered, it can take up to 1 week to get results depending on the test ordered and the lab/facility running  and resulting the test. - Normal or stable results, which do not need further discussion, may be released to your mychart immediately with attached note to you. A call may not be generated for normal results. Please make certain to sign up for mychart. If you have questions on how to activate your mychart you can call the front office.  - If your results need further discussion, our office will attempt to contact you via phone, and if unable to reach you after 2 attempts, we will release your abnormal result to your mychart with instructions.  - All results will be automatically released in mychart after 1 week.  - Your provider will provide you with explanation and instruction on all relevant material in your results. Please keep in mind, results and labs may appear confusing or abnormal to the untrained eye, but it does not mean they are actually abnormal for you personally. If you have any questions about your results that are not covered, or you desire more detailed explanation than what was provided, you should make an appointment with your provider to do so.   Our office handles many outgoing and incoming calls daily. If we have not contacted you within 1 week about your results, please check your mychart to see if there is a message first and if not, then contact our office.  In helping with this matter, you help decrease call volume, and therefore allow [place] to be able to respond to patients needs  more efficiently.   Acute office visits (sick visit):  An acute visit is intended for a new problem and are scheduled in shorter time slots to allow schedule openings for patients with new problems. This is the appropriate visit to discuss a new problem. Problems will not be addressed by phone call or Echart message. Appointment is needed if requesting treatment. In order to provide you with excellent quality medical care with proper time for you to explain your problem, have an exam and receive treatment  with instructions, these appointments should be limited to one new problem per visit. If you experience a new problem, in which you desire to be addressed, please make an acute office visit, we save openings on the schedule to accommodate you. Please do not save your new problem for any other type of visit, let us take care of it properly and quickly for you.   Follow up visits:  Depending on your condition(s) your provider will need to see you routinely in order to provide you with quality care and prescribe medication(s). Most chronic conditions (Example: hypertension, Diabetes, depression/anxiety... etc), require visits a couple times a year. Your provider will instruct you on proper follow up for your personal medical conditions and history. Please make certain to make follow up appointments for your condition as instructed. Failing to do so could result in lapse in your medication treatment/refills. If you request a refill, and are overdue to be seen on a condition, we will always provide you with a 30 day script (once) to allow you time to schedule.    Medicare wellness (well visit): - we have a wonderful Nurse Selena Batten(Kim), that will meet with you and provide you will yearly medicare wellness visits. These visits should occur yearly (can not be scheduled less than 1 calendar year apart) and cover preventive health, immunizations, advance directives and screenings you are entitled to yearly through your medicare benefits. Do not miss out on your entitled benefits, this is when medicare will pay for these benefits to be ordered for you.  These are strongly encouraged by your provider and is the appropriate type of visit to make certain you are up to date with all preventive health benefits. If you have not had your medicare wellness exam in the last 12 months, please make certain to schedule one by calling the office and schedule your medicare wellness with Selena BattenKim as soon as possible.   Yearly physical (well  visit):  - Adults are recommended to be seen yearly for physicals. Check with your insurance and date of your last physical, most insurances require one calendar year between physicals. Physicals include all preventive health topics, screenings, medical exam and labs that are appropriate for gender/age and history. You may have fasting labs needed at this visit. This is a well visit (not a sick visit), new problems should not be covered during this visit (see acute visit).  - Pediatric patients are seen more frequently when they are younger. Your provider will advise you on well child visit timing that is appropriate for your their age. - This is not a medicare wellness visit. Medicare wellness exams do not have an exam portion to the visit. Some medicare companies allow for a physical, some do not allow a yearly physical. If your medicare allows a yearly physical you can schedule the medicare wellness with our nurse Selena BattenKim and have your physical with your provider after, on the same day. Please check with insurance for your full benefits.  Late Policy/No Shows:  - all new patients should arrive 15-30 minutes earlier than appointment to allow [place] time  to  obtain all personal demographics,  insurance information and for you to complete office paperwork. - All established patients should arrive 10-15 minutes earlier than appointment time to update all information and be checked in .  - In our best efforts to run on time, if you are late for your appointment you will be asked to either reschedule or if able, we will work you back into the schedule. There will be a wait time to work you back in the schedule,  depending on availability.  - If you are unable to make it to your appointment as scheduled, please call 24 hours ahead of time to allow [place] to fill the time slot with someone else who needs to be seen. If you do not cancel your appointment ahead of time, you may be charged a no show fee.

## 2018-06-06 NOTE — Progress Notes (Signed)
Patient ID: April Bernard, female  DOB: Apr 29, 1986, 32 y.o.   MRN: [id number] Patient Care Team    Relationship Specialty Notifications Start End  Natalia Leatherwood, DO PCP - General Family Medicine  06/06/18     Chief Complaint  Patient presents with  . Establish Care  . ADHD    Subjective:  April Bernard is a 32 y.o.  female present for new patient establishment. All past medical history, surgical history, allergies, family history, immunizations, medications and social history were updated in the electronic medical record today. All recent labs, ED visits and hospitalizations within the last year were reviewed.  She, her husband and infant child recently moved from [place].   ADD:  Pt reports she was diagnosed with ADD in middle school. She has been on Adderall off and on over the years, with discontinuation in high school for a little while and when she lost insurance. She was established with a psychiatrist in 2013 that restarted the medication for her. Her PCP took over in 2015. She continued to take the medication until she became pregnant. She has now quit breastfeeding and would like to return to taking adderall Xr 20 mg QD. She is able to focus with medication, without she does struggle to get through her day and focus.  Indication: ADD Medication and dose: Adderall XR 20 mg QD # pills per month: 30 Last UDS date: 87/2019 contract signed (Y/N): y Date narcotic database last reviewed (include red flags): 06/06/18   Depression screen PHQ 2/9 06/06/2018  Decreased Interest 0  Down, Depressed, Hopeless 0  PHQ - 2 Score 0  Altered sleeping 0  Tired, decreased energy 0  Change in appetite 1  Feeling bad or failure about yourself  0  Trouble concentrating 0  Moving slowly or fidgety/restless 0  Suicidal thoughts 0  PHQ-9 Score 1   No flowsheet data found.    Current Exercise Habits: Home exercise routine, Type of exercise: walking, Time (Minutes): 60, Frequency  (Times/Week): 2, Weekly Exercise (Minutes/Week): 120, Intensity: Mild   No flowsheet data found.   Immunization History  Administered Date(s) Administered  . Influenza,inj,Quad PF,6+ Mos 09/19/2017    No exam data present  Past Medical History:  Diagnosis Date  . ADD (attention deficit disorder)   . Allergic rhinitis   . Chicken pox   . Depression   . Ganglion cyst of right foot   . History of pregnancy induced hypertension   . Preeclampsia    2018   No Known Allergies Past Surgical History:  Procedure Laterality Date  . CESAREAN SECTION  11/08/2016  . REFRACTIVE SURGERY Bilateral 2011   Family History  Problem Relation Age of Onset  . Arthritis Mother   . Hypertension Mother   . Hyperlipidemia Father   . Diabetes Sister   . Arthritis Maternal Grandmother   . Hypertension Maternal Grandmother   . Hypertension Maternal Grandfather   . Heart disease Maternal Grandfather   . Diabetes Maternal Grandfather   . Hypertension Paternal Grandmother   . Stroke Paternal Grandmother   . Hyperlipidemia Paternal Grandmother   . Breast cancer Paternal Grandmother   . Hypertension Paternal Grandfather   . Hyperlipidemia Paternal Grandfather   . Prostate cancer Paternal Grandfather    Social History   Socioeconomic History  . Marital status: Married    Spouse name: Not on file  . Number of children: Not on file  . Years of education: Not on file  . Highest  education level: Not on file  Occupational History  . Not on file  Social Needs  . Financial resource strain: Not on file  . Food insecurity:    Worry: Not on file    Inability: Not on file  . Transportation needs:    Medical: Not on file    Non-medical: Not on file  Tobacco Use  . Smoking status: Never Smoker  . Smokeless tobacco: Never Used  Substance and Sexual Activity  . Alcohol use: Yes  . Drug use: Never  . Sexual activity: Yes    Partners: Male  Lifestyle  . Physical activity:    Days per week: Not  on file    Minutes per session: Not on file  . Stress: Not on file  Relationships  . Social connections:    Talks on phone: Not on file    Gets together: Not on file    Attends religious service: Not on file    Active member of club or organization: Not on file    Attends meetings of clubs or organizations: Not on file    Relationship status: Not on file  . Intimate partner violence:    Fear of current or ex partner: Not on file    Emotionally abused: Not on file    Physically abused: Not on file    Forced sexual activity: Not on file  Other Topics Concern  . Not on file  Social History Narrative   Marital status/children/pets: married, 1 child.    Education/employment: B.S. Degree, works in [employer].   Safety:      -Wears a bicycle helmet riding a bike: No     -smoke alarm in the home:Yes     - wears seatbelt: Yes     - Feels safe in their relationships: Yes   Allergies as of 06/06/2018   No Known Allergies     Medication List        Accurate as of 06/06/18 12:50 PM. Always use your most recent med list.          amphetamine-dextroamphetamine 20 MG 24 hr capsule Commonly known as:  ADDERALL XR Take 1 capsule (20 mg total) by mouth every morning.       All past medical history, surgical history, allergies, family history, immunizations andmedications were updated in the EMR today and reviewed under the history and medication portions of their EMR.    No results found for this or any previous visit (from the past 2160 hour(s)).  Patient was never admitted.   ROS: 14 pt review of systems performed and negative (unless mentioned in an HPI)  Objective: BP 130/85 (BP Location: Left Arm, Patient Position: Sitting, Cuff Size: Large)   Pulse 81   Temp 98 F (36.7 C)   Resp 20   Ht 5' 4.5" (1.638 m)   Wt 198 lb 3.2 oz (89.9 kg)   LMP 05/20/2018   SpO2 97%   BMI 33.50 kg/m  Gen: Afebrile. No acute distress. Nontoxic in appearance, well-developed,  well-nourished,  Pleasant, caucasian female.  HENT: AT. [place]. MMM Eyes:Pupils Equal Round Reactive to light, Extraocular movements intact,  Conjunctiva without redness, discharge or icterus. Neck/lymp/endocrine: Supple,no lymphadenopathy, no thyromegaly CV: RRR no murmur, no edema, +2/4 P posterior tibialis pulses.  Chest: CTAB, no wheeze, rhonchi or crackles. Normal  Respiratory effort. good Air movement. Abd: Soft. NTND. BS present.  Skin: Warm and well-perfused. Skin intact. Neuro/Msk:  Normal gait. PERLA. EOMi. Alert. Oriented x3.  Psych: Normal affect, dress and demeanor. Normal speech. Normal thought content and judgment.   Assessment/plan: April BlacksmithLeanne Landgren is a 32 y.o. female present for establish care with ADD complaints.  Attention deficit disorder (ADD) without hyperactivity - restart adderall for her today. Pt aware this is a controlled substance. Face to face encounter every 3 months required. She will check with her insurance to see if she is able to have a 90 day script.  - watch BP on med.  Indication: ADD Medication and dose: Adderall XR 20 mg QD # pills per month: 30 Last UDS date: 87/2019 contract signed (Y/N): y Date narcotic database last reviewed (include red flags): 06/06/18 - Pain Mgmt, Profile 8 w/Conf, U   Return in about 3 months (around 09/06/2018) for add. 1-2 mos for CPE- pt aware preventive and controlled substance can not be same visit.   Note is dictated utilizing voice recognition software. Although note has been proof read prior to signing, occasional typographical errors still can be missed. If any questions arise, please do not hesitate to call for verification.  Electronically signed by: Felix Pacinienee Kiarrah Rausch, DO [facility]

## 2018-06-09 LAB — PAIN MGMT, PROFILE 8 W/CONF, U
6 ACETYLMORPHINE: NEGATIVE ng/mL (ref <10)
Alcohol Metabolites: POSITIVE ng/mL — AB (ref <500)
Amphetamines: NEGATIVE ng/mL (ref <500)
BUPRENORPHINE, URINE: NEGATIVE ng/mL (ref <5)
Benzodiazepines: NEGATIVE ng/mL (ref <100)
Cocaine Metabolite: NEGATIVE ng/mL (ref <150)
Creatinine: 105.8 mg/dL
ETHYL GLUCURONIDE (ETG): 3749 ng/mL — AB (ref <500)
ETHYL SULFATE (ETS): 1002 ng/mL — AB (ref <100)
MARIJUANA METABOLITE: NEGATIVE ng/mL (ref <20)
MDMA: NEGATIVE ng/mL (ref <500)
OPIATES: NEGATIVE ng/mL (ref <100)
OXYCODONE: NEGATIVE ng/mL (ref <100)
Oxidant: NEGATIVE ug/mL (ref <200)
pH: 5.85 (ref 4.5–9.0)

## 2018-06-12 ENCOUNTER — Encounter: Payer: Self-pay | Admitting: Family Medicine

## 2018-07-06 ENCOUNTER — Telehealth: Payer: Self-pay | Admitting: Family Medicine

## 2018-07-06 NOTE — Telephone Encounter (Signed)
Spoke with patient she states her pharmacy said she didn't have any refills I explained to patient these were sent as 3 separate prescriptions and to call her pharmacy and have them check to see if they are on file since we have confirmed receipt from pharmacy.

## 2018-07-06 NOTE — Telephone Encounter (Signed)
Copied from CRM [id number]. Topic: Quick Communication - Rx Refill/Question >> Jul 06, 2018  8:14 AM Gean Birchwood R wrote: Medication:amphetamine-dextroamphetamine (ADDERALL XR) 20 MG 24 hr capsule  Has the patient contacted their pharmacy? Yes  Preferred Pharmacy (with phone number or street name): [facility] [address] [place] - [address] [place] [phone] (Phone) [phone] (Fax)    Agent: Please be advised that RX refills may take up to 3 business days. We ask that you follow-up with your pharmacy.

## 2018-07-25 ENCOUNTER — Encounter: Payer: Commercial Managed Care - PPO | Admitting: Family Medicine

## 2018-08-27 ENCOUNTER — Ambulatory Visit: Payer: Commercial Managed Care - PPO | Admitting: Family Medicine

## 2018-08-27 ENCOUNTER — Encounter: Payer: Self-pay | Admitting: Family Medicine

## 2018-08-27 VITALS — BP 111/74 | HR 90 | Resp 16 | Ht 65.0 in | Wt 195.0 lb

## 2018-08-27 DIAGNOSIS — Z79899 Other long term (current) drug therapy: Secondary | ICD-10-CM | POA: Diagnosis not present

## 2018-08-27 DIAGNOSIS — F988 Other specified behavioral and emotional disorders with onset usually occurring in childhood and adolescence: Secondary | ICD-10-CM | POA: Diagnosis not present

## 2018-08-27 MED ORDER — AMPHETAMINE-DEXTROAMPHET ER 20 MG PO CP24: 20.0000 mg | ORAL_CAPSULE | ORAL | 0 refills | Status: DC

## 2018-08-27 NOTE — Patient Instructions (Signed)
Please help us help you:  We are honored you have chosen [facility] for your Primary Care home. Below you will find basic instructions that you may need to access in the future. Please help us help you by reading the instructions, which cover many of the frequent questions we experience.   Prescription refills and request:  -In order to allow more efficient response time, please call your pharmacy for all refills. They will forward the request electronically to us. This allows for the quickest possible response. Request left on a nurse line can take longer to refill, since these are checked as time allows between office patients and other phone calls.  - refill request can take up to 3-5 working days to complete.  - If request is sent electronically and request is appropiate, it is usually completed in 1-2 business days.  - all patients will need to be seen routinely for all chronic medical conditions requiring prescription medications (see follow-up below). If you are overdue for follow up on your condition, you will be asked to make an appointment and we will call in enough medication to cover you until your appointment (up to 30 days).  - all controlled substances will require a face to face visit to request/refill.  - if you desire your prescriptions to go through a new pharmacy, and have an active script at original pharmacy, you will need to call your pharmacy and have scripts transferred to new pharmacy. This is completed between the pharmacy locations and not by your provider.    Results: If any images or labs were ordered, it can take up to 1 week to get results depending on the test ordered and the lab/facility running and resulting the test. - Normal or stable results, which do not need further discussion, may be released to your mychart immediately with attached note to you. A call may not be generated for normal results. Please make certain to sign up for mychart. If you have  questions on how to activate your mychart you can call the front office.  - If your results need further discussion, our office will attempt to contact you via phone, and if unable to reach you after 2 attempts, we will release your abnormal result to your mychart with instructions.  - All results will be automatically released in mychart after 1 week.  - Your provider will provide you with explanation and instruction on all relevant material in your results. Please keep in mind, results and labs may appear confusing or abnormal to the untrained eye, but it does not mean they are actually abnormal for you personally. If you have any questions about your results that are not covered, or you desire more detailed explanation than what was provided, you should make an appointment with your provider to do so.   Our office handles many outgoing and incoming calls daily. If we have not contacted you within 1 week about your results, please check your mychart to see if there is a message first and if not, then contact our office.  In helping with this matter, you help decrease call volume, and therefore allow us to be able to respond to patients needs more efficiently.   Acute office visits (sick visit):  An acute visit is intended for a new problem and are scheduled in shorter time slots to allow schedule openings for patients with new problems. This is the appropriate visit to discuss a new problem. Problems will not be addressed by   phone call or Echart message. Appointment is needed if requesting treatment. In order to provide you with excellent quality medical care with proper time for you to explain your problem, have an exam and receive treatment with instructions, these appointments should be limited to one new problem per visit. If you experience a new problem, in which you desire to be addressed, please make an acute office visit, we save openings on the schedule to accommodate you. Please do not save your  new problem for any other type of visit, let us take care of it properly and quickly for you.   Follow up visits:  Depending on your condition(s) your provider will need to see you routinely in order to provide you with quality care and prescribe medication(s). Most chronic conditions (Example: hypertension, Diabetes, depression/anxiety... etc), require visits a couple times a year. Your provider will instruct you on proper follow up for your personal medical conditions and history. Please make certain to make follow up appointments for your condition as instructed. Failing to do so could result in lapse in your medication treatment/refills. If you request a refill, and are overdue to be seen on a condition, we will always provide you with a 30 day script (once) to allow you time to schedule.    Medicare wellness (well visit): - we have a wonderful Nurse (Kim), that will meet with you and provide you will yearly medicare wellness visits. These visits should occur yearly (can not be scheduled less than 1 calendar year apart) and cover preventive health, immunizations, advance directives and screenings you are entitled to yearly through your medicare benefits. Do not miss out on your entitled benefits, this is when medicare will pay for these benefits to be ordered for you.  These are strongly encouraged by your provider and is the appropriate type of visit to make certain you are up to date with all preventive health benefits. If you have not had your medicare wellness exam in the last 12 months, please make certain to schedule one by calling the office and schedule your medicare wellness with Kim as soon as possible.   Yearly physical (well visit):  - Adults are recommended to be seen yearly for physicals. Check with your insurance and date of your last physical, most insurances require one calendar year between physicals. Physicals include all preventive health topics, screenings, medical exam and labs  that are appropriate for gender/age and history. You may have fasting labs needed at this visit. This is a well visit (not a sick visit), new problems should not be covered during this visit (see acute visit).  - Pediatric patients are seen more frequently when they are younger. Your provider will advise you on well child visit timing that is appropriate for your their age. - This is not a medicare wellness visit. Medicare wellness exams do not have an exam portion to the visit. Some medicare companies allow for a physical, some do not allow a yearly physical. If your medicare allows a yearly physical you can schedule the medicare wellness with our nurse Kim and have your physical with your provider after, on the same day. Please check with insurance for your full benefits.   Late Policy/No Shows:  - all new patients should arrive 15-30 minutes earlier than appointment to allow us time  to  obtain all personal demographics,  insurance information and for you to complete office paperwork. - All established patients should arrive 10-15 minutes earlier than appointment time to update all   information and be checked in .  - In our best efforts to run on time, if you are late for your appointment you will be asked to either reschedule or if able, we will work you back into the schedule. There will be a wait time to work you back in the schedule,  depending on availability.  - If you are unable to make it to your appointment as scheduled, please call 24 hours ahead of time to allow us to fill the time slot with someone else who needs to be seen. If you do not cancel your appointment ahead of time, you may be charged a no show fee.  

## 2018-08-27 NOTE — Progress Notes (Signed)
Patient ID: April Bernard, female  DOB: 06/01/1986, 32 y.o.   MRN: [id number] Patient Care Team    Relationship Specialty Notifications Start End  Natalia Leatherwood, DO PCP - General Family Medicine  06/06/18     Chief Complaint  Patient presents with  . Follow-up    ADD medication    Subjective:  April Bernard is a 32 y.o.  female present for  ADD:  Pt reports she was diagnosed with ADD in middle school. She has been on Adderall off and on over the years, with discontinuation in high school for a little while and when she lost insurance. She was established with a psychiatrist in 2013 that restarted the medication for her. Her PCP took over in 2015. She continued to take the medication until she became pregnant. Doing well on medication, reports compliance. Focus has improved.  Indication: ADD Medication and dose: Adderall XR 20 mg QD # pills per month: 30 Last UDS date: 87/2019 contract signed (Y/N): y Date narcotic database last reviewed (include red flags): 08/27/18   Depression screen PHQ 2/9 06/06/2018  Decreased Interest 0  Down, Depressed, Hopeless 0  PHQ - 2 Score 0  Altered sleeping 0  Tired, decreased energy 0  Change in appetite 1  Feeling bad or failure about yourself  0  Trouble concentrating 0  Moving slowly or fidgety/restless 0  Suicidal thoughts 0  PHQ-9 Score 1     No flowsheet data found.   Immunization History  Administered Date(s) Administered  . Influenza,inj,Quad PF,6+ Mos 09/19/2017  . Influenza-Unspecified 08/24/2015  . Tdap 05/08/2014    No exam data present  Past Medical History:  Diagnosis Date  . ADD (attention deficit disorder)   . Allergic rhinitis   . Chicken pox   . Depression   . Ganglion cyst of right foot   . History of pregnancy induced hypertension   . Preeclampsia    2018   No Known Allergies Past Surgical History:  Procedure Laterality Date  . CESAREAN SECTION  11/08/2016  . REFRACTIVE SURGERY Bilateral  2011   Family History  Problem Relation Age of Onset  . Arthritis Mother   . Hypertension Mother   . Hyperlipidemia Father   . Diabetes Sister   . Arthritis Maternal Grandmother   . Hypertension Maternal Grandmother   . Hypertension Maternal Grandfather   . Heart disease Maternal Grandfather   . Diabetes Maternal Grandfather   . Hypertension Paternal Grandmother   . Stroke Paternal Grandmother   . Hyperlipidemia Paternal Grandmother   . Breast cancer Paternal Grandmother   . Hypertension Paternal Grandfather   . Hyperlipidemia Paternal Grandfather   . Prostate cancer Paternal Grandfather    Social History   Socioeconomic History  . Marital status: Married    Spouse name: Not on file  . Number of children: Not on file  . Years of education: Not on file  . Highest education level: Not on file  Occupational History  . Not on file  Social Needs  . Financial resource strain: Not on file  . Food insecurity:    Worry: Not on file    Inability: Not on file  . Transportation needs:    Medical: Not on file    Non-medical: Not on file  Tobacco Use  . Smoking status: Never Smoker  . Smokeless tobacco: Never Used  Substance and Sexual Activity  . Alcohol use: Yes  . Drug use: Never  . Sexual activity: Yes  Partners: Male  Lifestyle  . Physical activity:    Days per week: Not on file    Minutes per session: Not on file  . Stress: Not on file  Relationships  . Social connections:    Talks on phone: Not on file    Gets together: Not on file    Attends religious service: Not on file    Active member of club or organization: Not on file    Attends meetings of clubs or organizations: Not on file    Relationship status: Not on file  . Intimate partner violence:    Fear of current or ex partner: Not on file    Emotionally abused: Not on file    Physically abused: Not on file    Forced sexual activity: Not on file  Other Topics Concern  . Not on file  Social History  Narrative   Marital status/children/pets: married, 1 child.    Education/employment: B.S. Degree, works in [employer].   Safety:      -Wears a bicycle helmet riding a bike: No     -smoke alarm in the home:Yes     - wears seatbelt: Yes     - Feels safe in their relationships: Yes   Allergies as of 08/27/2018   No Known Allergies     Medication List        Accurate as of 08/27/18  8:56 AM. Always use your most recent med list.          amphetamine-dextroamphetamine 20 MG 24 hr capsule Commonly known as:  ADDERALL XR Take 1 capsule (20 mg total) by mouth every morning.   fluticasone 50 MCG/ACT nasal spray Commonly known as:  FLONASE Place into both nostrils daily.   loratadine 10 MG tablet Commonly known as:  CLARITIN Take 10 mg by mouth daily.       All past medical history, surgical history, allergies, family history, immunizations andmedications were updated in the EMR today and reviewed under the history and medication portions of their EMR.    Recent Results (from the past 2160 hour(s))  Pain Mgmt, Profile 8 w/Conf, U     Status: Abnormal   Collection Time: 06/06/18  9:54 AM  Result Value Ref Range   Creatinine 105.8 > or = 20. mg/dL   pH 2.44 4.5 - 9.0   Oxidant NEGATIVE <200 mcg/mL   Amphetamines NEGATIVE <500 ng/mL   medMATCH Amphetamines CONSISTENT    Benzodiazepines NEGATIVE <100 ng/mL   medMATCH Benzodiazepines CONSISTENT    Marijuana Metabolite NEGATIVE <20 ng/mL   medMATCH Marijuana Metab CONSISTENT    Cocaine Metabolite NEGATIVE <150 ng/mL   medMATCH Cocaine Metab CONSISTENT    Opiates NEGATIVE <100 ng/mL   medMATCH Opiates CONSISTENT    Oxycodone NEGATIVE <100 ng/mL   medMATCH Oxycodone CONSISTENT    Buprenorphine, Urine NEGATIVE <5 ng/mL   medMATCH Buprenorphine CONSISTENT    MDMA NEGATIVE <500 ng/mL   [facility] MDMA CONSISTENT    Alcohol Metabolites POSITIVE (A) <500 ng/mL   Ethyl Glucuronide (ETG) 3,749 (H) <500 ng/mL    Comment: See  Note 1   medMATCH ETG INCONSISTENT    Ethyl Sulfate (ETS) 1,002 (H) <100 ng/mL    Comment: See Note 1   medMATCH ETS INCONSISTENT     Comment: Note 1 . This test was developed and its analytical performance  characteristics have been determined by [employer]. It has not been cleared or approved by the FDA. This assay has been  validated pursuant to the CLIA  regulations and is used for clinical purposes.    6 Acetylmorphine NEGATIVE <10 ng/mL   medMATCH 6 Acetylmorphine CONSISTENT     Comment: This drug testing is for medical treatment only.   Analysis was performed as non-forensic testing and  these results should be used only by healthcare  providers to render diagnosis or treatment, or to  monitor progress of medical conditions. Sharyn Lull comments are:  - present when drug test results may be the result of     metabolism of one or more drugs or when results are     inconsistent with prescribed medication(s) listed.  - may be blank when drug results are consistent with     prescribed medication(s) listed. . For assistance with interpreting these drug results,  please contact a [employer] Toxicology  Specialist: [phone] TOX [phone]), M-F,  8am-6pm EST. This drug testing is for medical treatment only.   Analysis was performed as non-forensic testing and  these results should be used only by healthcare  providers to render diagnosis or treatment, or to  monitor progress of medical conditions. Sharyn Lull comments are:  - present when  drug test results may be the result of     metabolism of one or more drugs or when results are     inconsistent with prescribed medication(s) listed.  - may be blank when drug results are consistent with     prescribed medication(s) listed. . For assistance with interpreting these drug results,  please contact a [employer] Toxicology  Specialist: [phone] TOX [phone]), M-F,  8am-6pm EST. This  drug testing is for medical treatment only.   Analysis was performed as non-forensic testing and  these results should be used only by healthcare  providers to render diagnosis or treatment, or to  monitor progress of medical conditions. Sharyn Lull comments are:  - present when drug test results may be the result of     metabolism of one or more drugs or when results are     inconsistent with prescribed medication(s) listed.  - may be blank when drug results are consistent with     prescribed medication(s) listed. . For assistance with interpreting these d rug results,  please contact a [employer], [employer] Toxicology  Specialist: [phone] TOX [phone]), M-F,  8am-6pm EST. This drug testing is for medical treatment only.   Analysis was performed as non-forensic testing and  these results should be used only by healthcare  providers to render diagnosis or treatment, or to  monitor progress of medical conditions. Sharyn Lull comments are:  - present when drug test results may be the result of     metabolism of one or more drugs or when results are     inconsistent with prescribed medication(s) listed.  - may be blank when drug results are consistent with     prescribed medication(s) listed. . For assistance with interpreting these drug results,  please contact a [employer] Toxicology  Specialist: [phone] TOX [phone]), M-F,  8am-6pm EST. This drug testing is for medical treatment only.   Analysis was performed as non-forensic testing and  these results should be used only by healthcare  provi ders to render diagnosis or treatment, or to  monitor progress of medical conditions. Sharyn Lull comments are:  - present when drug test results may be the result of     metabolism of one or more drugs or when results are     inconsistent  with prescribed medication(s) listed.  - may be blank when drug results are consistent with     prescribed medication(s)  listed. . For assistance with interpreting these drug results,  please contact a [employer] Toxicology  Specialist: [phone] TOX [phone]), M-F,  8am-6pm EST.     Patient was never admitted.   ROS: 14 pt review of systems performed and negative (unless mentioned in an HPI)  Objective: BP 111/74 (BP Location: Left Arm, Patient Position: Sitting, Cuff Size: Normal)   Pulse 90   Resp 16   Ht 5\' 5"  (1.651 m)   Wt 195 lb (88.5 kg)   SpO2 98%   BMI 32.45 kg/m  Gen: Afebrile. No acute distress. nontoxic HENT: AT. [place].  MMM.  Eyes:Pupils Equal Round Reactive to light, Extraocular movements intact,  Conjunctiva without redness, discharge or icterus. CV: RRR, no edema, +2/4 P posterior tibialis pulses Chest: CTAB, no wheeze or crackles Skin: no rashes, purpura or petechiae.  Neuro:  Normal gait. PERLA. EOMi. Alert. Oriented.  Psych: Normal affect, dress and demeanor. Normal speech. Normal thought content and judgment.    Assessment/plan: Megean Fabio is a 32 y.o. female present for establish care with ADD complaints.  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 Insurance required 30 d scripts only.   Indication: ADD Medication and dose: Adderall XR 20 mg QD # pills per month: 30 Last UDS date: 87/2019 contract signed (Y/N): y Date narcotic database last reviewed (include red flags): 08/27/18  Refills provided today for 3 mos - follow up 3 mos- end of Jan.     Return in about 3 months (around 11/27/2018) for add.    Note is dictated utilizing voice recognition software. Although note has been proof read prior to signing, occasional typographical errors still can be missed. If any questions arise, please do not hesitate to call for verification.  Electronically signed by: Felix Pacini, DO [facility]

## 2018-09-13 ENCOUNTER — Encounter: Payer: Commercial Managed Care - PPO | Admitting: Family Medicine

## 2018-09-18 ENCOUNTER — Encounter: Payer: Self-pay | Admitting: Family Medicine

## 2018-09-18 ENCOUNTER — Ambulatory Visit (INDEPENDENT_AMBULATORY_CARE_PROVIDER_SITE_OTHER): Payer: Commercial Managed Care - PPO | Admitting: Family Medicine

## 2018-09-18 VITALS — BP 127/90 | HR 100 | Temp 97.7°F | Resp 20 | Ht 65.0 in | Wt 190.0 lb

## 2018-09-18 DIAGNOSIS — Z130 Encounter for screening for diseases of the blood and blood-forming organs and certain disorders involving the immune mechanism: Secondary | ICD-10-CM

## 2018-09-18 DIAGNOSIS — Z131 Encounter for screening for diabetes mellitus: Secondary | ICD-10-CM | POA: Diagnosis not present

## 2018-09-18 DIAGNOSIS — E669 Obesity, unspecified: Secondary | ICD-10-CM | POA: Diagnosis not present

## 2018-09-18 DIAGNOSIS — Z0000 Encounter for general adult medical examination without abnormal findings: Secondary | ICD-10-CM

## 2018-09-18 DIAGNOSIS — Z79899 Other long term (current) drug therapy: Secondary | ICD-10-CM

## 2018-09-18 DIAGNOSIS — F909 Attention-deficit hyperactivity disorder, unspecified type: Secondary | ICD-10-CM

## 2018-09-18 NOTE — Progress Notes (Signed)
   Patient ID: April Bernard, female  DOB: 06/30/1986, 32 y.o.   MRN: [id number] Patient Care Team    Relationship Specialty Notifications Start End  Kuneff, Renee A, DO PCP - General Family Medicine  06/06/18     Chief Complaint  Patient presents with  . Annual Exam    Subjective:  April Bernard is a 32 y.o.  Female  present for CPE. All past medical history, surgical history, allergies, family history, immunizations, medications and social history were updated in the electronic medical record today. All recent labs, ED visits and hospitalizations within the last year were reviewed.  Health maintenance:  Colonoscopy: no fhx- routine screen 50. Mammogram: FHx, PGM. Routine screen.  Cervical cancer screening: last pap: 04/2016, completed by: referral in place. (Dr. Tavon) Immunizations: tdap 2015 UTD, Influenza 08/2018 UTD (encouraged yearly) Infectious disease screening: HIV completed 2016 DEXA: routine screen Assistive device: none Oxygen use:none Patient has a Dental home. Hospitalizations/ED visits: reviewed  Depression screen PHQ 2/9 09/18/2018 06/06/2018  Decreased Interest 0 0  Down, Depressed, Hopeless 0 0  PHQ - 2 Score 0 0  Altered sleeping - 0  Tired, decreased energy - 0  Change in appetite - 1  Feeling bad or failure about yourself  - 0  Trouble concentrating - 0  Moving slowly or fidgety/restless - 0  Suicidal thoughts - 0  PHQ-9 Score - 1   No flowsheet data found.   Current Exercise Habits: Structured exercise class, Type of exercise: strength training/weights;calisthenics, Time (Minutes): 60, Frequency (Times/Week): 2, Weekly Exercise (Minutes/Week): 120, Intensity: Moderate     Immunization History  Administered Date(s) Administered  . Influenza,inj,Quad PF,6+ Mos 09/19/2017, 09/01/2018  . Influenza-Unspecified 08/24/2015  . Tdap 05/08/2014     Past Medical History:  Diagnosis Date  . ADD (attention deficit disorder)   . Allergic rhinitis    . Chicken pox   . Depression   . Ganglion cyst of right foot   . History of pregnancy induced hypertension   . Preeclampsia    2018   No Known Allergies Past Surgical History:  Procedure Laterality Date  . CESAREAN SECTION  11/08/2016  . REFRACTIVE SURGERY Bilateral 2011   Family History  Problem Relation Age of Onset  . Arthritis Mother   . Hypertension Mother   . Hyperlipidemia Father   . Diabetes Sister   . Arthritis Maternal Grandmother   . Hypertension Maternal Grandmother   . Hypertension Maternal Grandfather   . Heart disease Maternal Grandfather   . Diabetes Maternal Grandfather   . Hypertension Paternal Grandmother   . Stroke Paternal Grandmother   . Hyperlipidemia Paternal Grandmother   . Breast cancer Paternal Grandmother   . Hypertension Paternal Grandfather   . Hyperlipidemia Paternal Grandfather   . Prostate cancer Paternal Grandfather    Social History   Socioeconomic History  . Marital status: Married    Spouse name: Not on file  . Number of children: Not on file  . Years of education: Not on file  . Highest education level: Not on file  Occupational History  . Not on file  Social Needs  . Financial resource strain: Not on file  . Food insecurity:    Worry: Not on file    Inability: Not on file  . Transportation needs:    Medical: Not on file    Non-medical: Not on file  Tobacco Use  . Smoking status: Never Smoker  . Smokeless tobacco: Never Used  Substance and Sexual Activity  .   Alcohol use: Yes  . Drug use: Never  . Sexual activity: Yes    Partners: Male  Lifestyle  . Physical activity:    Days per week: Not on file    Minutes per session: Not on file  . Stress: Not on file  Relationships  . Social connections:    Talks on phone: Not on file    Gets together: Not on file    Attends religious service: Not on file    Active member of club or organization: Not on file    Attends meetings of clubs or organizations: Not on file     Relationship status: Not on file  . Intimate partner violence:    Fear of current or ex partner: Not on file    Emotionally abused: Not on file    Physically abused: Not on file    Forced sexual activity: Not on file  Other Topics Concern  . Not on file  Social History Narrative   Marital status/children/pets: married, 1 child.    Education/employment: B.S. Degree, works in Human resources.   Safety:      -Wears a bicycle helmet riding a bike: No     -smoke alarm in the home:Yes     - wears seatbelt: Yes     - Feels safe in their relationships: Yes   Allergies as of 09/18/2018   No Known Allergies     Medication List        Accurate as of 09/18/18  2:55 PM. Always use your most recent med list.          amphetamine-dextroamphetamine 20 MG 24 hr capsule Commonly known as:  ADDERALL XR Take 1 capsule (20 mg total) by mouth every morning.   fluticasone 50 MCG/ACT nasal spray Commonly known as:  FLONASE Place into both nostrils daily.   loratadine 10 MG tablet Commonly known as:  CLARITIN Take 10 mg by mouth daily.       All past medical history, surgical history, allergies, family history, immunizations andmedications were updated in the EMR today and reviewed under the history and medication portions of their EMR.     No results found for this or any previous visit (from the past 2160 hour(s)).  Patient was never admitted.   ROS: 14 pt review of systems performed and negative (unless mentioned in an HPI)  Objective: BP 127/90 (BP Location: Right Arm, Patient Position: Sitting, Cuff Size: Large)   Pulse 100   Temp 97.7 F (36.5 C)   Resp 20   Ht 5' 5" (1.651 m)   Wt 190 lb (86.2 kg)   LMP 08/18/2018   SpO2 100%   BMI 31.62 kg/m  Gen: Afebrile. No acute distress. Nontoxic in appearance, well-developed, well-nourished,  Obese, pleasant caucasian female HENT: AT. [place]. Bilateral TM visualized and normal in appearance, normal external auditory canal. MMM, no  oral lesions, adequate dentition. Bilateral nares within normal limits. Throat without erythema, ulcerations or exudates. no Cough on exam, no hoarseness on exam. Eyes:Pupils Equal Round Reactive to light, Extraocular movements intact,  Conjunctiva without redness, discharge or icterus. Neck/lymp/endocrine: Supple,no lymphadenopathy, no thyromegaly CV: RRR no murmur, no edema, +2/4 P posterior tibialis pulses. no carotid bruits. No JVD. Chest: CTAB, no wheeze, rhonchi or crackles. normal Respiratory effort. Good Air movement. Abd: Soft. obese. NTND. BS present. no Masses palpated. No hepatosplenomegaly. No rebound tenderness or guarding. Skin: no rashes, purpura or petechiae. Warm and well-perfused. Skin intact. Neuro/Msk: Normal gait. PERLA. EOMi. Alert.   Oriented x3.  Cranial nerves II through XII intact. Muscle strength 5/5 upper/lower extremity. DTRs equal bilaterally. Psych: Normal affect, dress and demeanor. Normal speech. Normal thought content and judgment.  No exam data present  Assessment/plan: April Bernard is a 32 y.o. female present for CPE. Obesity (BMI 30-39.9) - diet and exercise modifications.  - Lipid panel Attention deficit hyperactivity disorder (ADHD), unspecified ADHD type - TSH Encounter for long-term current use of medication - Comp Met (CMET) Diabetes mellitus screening - HgB A1c Screening for deficiency anemia - CBC w/Diff Encounter for preventive health examination Patient was encouraged to exercise greater than 150 minutes a week. Patient was encouraged to choose a diet filled with fresh fruits and vegetables, and lean meats. AVS provided to patient today for education/recommendation on gender specific health and safety maintenance. Colonoscopy: no fhx- routine screen 50. Mammogram: FHx, PGM. Routine screen.  Cervical cancer screening: last pap: 04/2016, completed by: referral in place.  Immunizations: tdap 2015 UTD, Influenza 08/2018 UTD (encouraged  yearly) Infectious disease screening: HIV completed 2016 DEXA: routine screen  Return in about 1 year (around 09/19/2019) for CPE.  Electronically signed by: Renee Kuneff, DO [facility]  

## 2018-09-18 NOTE — Patient Instructions (Addendum)

## 2018-09-19 LAB — CBC WITH DIFFERENTIAL/PLATELET
BASOS PCT: 0.6 %
Basophils Absolute: 61 cells/uL (ref 0–200)
EOS PCT: 3.5 %
Eosinophils Absolute: 357 cells/uL (ref 15–500)
HEMATOCRIT: 39.7 % (ref 35.0–45.0)
Hemoglobin: 13.4 g/dL (ref 11.7–15.5)
Lymphs Abs: 2846 cells/uL (ref 850–3900)
MCH: 27.9 pg (ref 27.0–33.0)
MCHC: 33.8 g/dL (ref 32.0–36.0)
MCV: 82.7 fL (ref 80.0–100.0)
MPV: 12.3 fL (ref 7.5–12.5)
Monocytes Relative: 4.7 %
Neutro Abs: 6457 cells/uL (ref 1500–7800)
Neutrophils Relative %: 63.3 %
Platelets: 386 10*3/uL (ref 140–400)
RBC: 4.8 10*6/uL (ref 3.80–5.10)
RDW: 13.2 % (ref 11.0–15.0)
TOTAL LYMPHOCYTE: 27.9 %
WBC mixed population: 479 cells/uL (ref 200–950)
WBC: 10.2 10*3/uL (ref 3.8–10.8)

## 2018-09-19 LAB — LIPID PANEL
CHOLESTEROL: 225 mg/dL — AB (ref <200)
HDL: 56 mg/dL (ref >50)
LDL CHOLESTEROL (CALC): 147 mg/dL — AB
Non-HDL Cholesterol (Calc): 169 mg/dL (calc) — ABNORMAL HIGH (ref <130)
TRIGLYCERIDES: 108 mg/dL (ref <150)
Total CHOL/HDL Ratio: 4 (calc) (ref <5.0)

## 2018-09-19 LAB — HEMOGLOBIN A1C
EAG (MMOL/L): 5.8 (calc)
Hgb A1c MFr Bld: 5.3 % of total Hgb (ref <5.7)
Mean Plasma Glucose: 105 (calc)

## 2018-09-19 LAB — COMPREHENSIVE METABOLIC PANEL
AG Ratio: 1.5 (calc) (ref 1.0–2.5)
ALBUMIN MSPROF: 4.5 g/dL (ref 3.6–5.1)
ALT: 22 U/L (ref 6–29)
AST: 18 U/L (ref 10–30)
Alkaline phosphatase (APISO): 103 U/L (ref 33–115)
BUN: 14 mg/dL (ref 7–25)
CHLORIDE: 105 mmol/L (ref 98–110)
CO2: 24 mmol/L (ref 20–32)
CREATININE: 0.72 mg/dL (ref 0.50–1.10)
Calcium: 10 mg/dL (ref 8.6–10.2)
GLOBULIN: 3 g/dL (ref 1.9–3.7)
Glucose, Bld: 86 mg/dL (ref 65–99)
POTASSIUM: 4.1 mmol/L (ref 3.5–5.3)
SODIUM: 139 mmol/L (ref 135–146)
TOTAL PROTEIN: 7.5 g/dL (ref 6.1–8.1)
Total Bilirubin: 0.5 mg/dL (ref 0.2–1.2)

## 2018-09-19 LAB — TSH: TSH: 0.83 mIU/L

## 2018-11-27 ENCOUNTER — Ambulatory Visit: Payer: Commercial Managed Care - PPO | Admitting: Family Medicine

## 2018-11-27 ENCOUNTER — Encounter: Payer: Self-pay | Admitting: Family Medicine

## 2018-11-27 VITALS — BP 120/84 | HR 83 | Temp 98.0°F | Resp 16 | Ht 65.0 in | Wt 195.1 lb

## 2018-11-27 DIAGNOSIS — E669 Obesity, unspecified: Secondary | ICD-10-CM

## 2018-11-27 DIAGNOSIS — F988 Other specified behavioral and emotional disorders with onset usually occurring in childhood and adolescence: Secondary | ICD-10-CM

## 2018-11-27 NOTE — Progress Notes (Signed)
Patient ID: [name], female  DOB: 1986/01/21, 33 y.o.   MRN: [id number] Patient Care Team    Relationship Specialty Notifications Start End  Natalia Leatherwood, DO PCP - General Family Medicine  06/06/18     Chief Complaint  Patient presents with  . Follow-up    Not fasting. No complaints     Subjective:  Kashia Zalesky is a 33 y.o.  female present for  ADD:  Pt reports she was diagnosed with ADD in middle school. She has been on Adderall off and on over the years, with discontinuation in high school for a little while and when she lost insurance. She was established with a psychiatrist in 2013 that restarted the medication for her. Her PCP took over in 2015. She continued to take the medication until she became pregnant. We took over management of her ADD on establishment. She has been doing well. No complaints.  Indication: ADD Medication and dose: Adderall XR 20 mg QD # pills per month: 30 Last UDS date: 87/2019 contract signed (Y/N): yes 05/2018 Date narcotic database last reviewed (include red flags): 11/28/18   Depression screen [facility] 2/9 11/28/2018 09/18/2018 06/06/2018  Decreased Interest 0 0 0  Down, Depressed, Hopeless 0 0 0  PHQ - 2 Score 0 0 0  Altered sleeping - - 0  Tired, decreased energy - - 0  Change in appetite - - 1  Feeling bad or failure about yourself  - - 0  Trouble concentrating - - 0  Moving slowly or fidgety/restless - - 0  Suicidal thoughts - - 0  PHQ-9 Score - - 1     No flowsheet data found.   Immunization History  Administered Date(s) Administered  . Influenza,inj,Quad PF,6+ Mos 09/19/2017, 09/01/2018  . Influenza-Unspecified 08/24/2015  . Tdap 05/08/2014    No exam data present  Past Medical History:  Diagnosis Date  . ADD (attention deficit disorder)   . Allergic rhinitis   . Chicken pox   . Depression   . Ganglion cyst of right foot   . History of pregnancy induced hypertension   . Preeclampsia    2018   No Known  Allergies Past Surgical History:  Procedure Laterality Date  . CESAREAN SECTION  11/08/2016  . REFRACTIVE SURGERY Bilateral 2011   Family History  Problem Relation Age of Onset  . Arthritis Mother   . Hypertension Mother   . Hyperlipidemia Father   . Diabetes Sister   . Arthritis Maternal Grandmother   . Hypertension Maternal Grandmother   . Hypertension Maternal Grandfather   . Heart disease Maternal Grandfather   . Diabetes Maternal Grandfather   . Hypertension Paternal Grandmother   . Stroke Paternal Grandmother   . Hyperlipidemia Paternal Grandmother   . Breast cancer Paternal Grandmother   . Hypertension Paternal Grandfather   . Hyperlipidemia Paternal Grandfather   . Prostate cancer Paternal Grandfather    Social History   Socioeconomic History  . Marital status: Married    Spouse name: Not on file  . Number of children: Not on file  . Years of education: Not on file  . Highest education level: Not on file  Occupational History  . Not on file  Social Needs  . Financial resource strain: Not on file  . Food insecurity:    Worry: Not on file    Inability: Not on file  . Transportation needs:    Medical: Not on file    Non-medical: Not on file  Tobacco Use  . Smoking status: Never Smoker  . Smokeless tobacco: Never Used  Substance and Sexual Activity  . Alcohol use: Yes  . Drug use: Never  . Sexual activity: Yes    Partners: Male  Lifestyle  . Physical activity:    Days per week: Not on file    Minutes per session: Not on file  . Stress: Not on file  Relationships  . Social connections:    Talks on phone: Not on file    Gets together: Not on file    Attends religious service: Not on file    Active member of club or organization: Not on file    Attends meetings of clubs or organizations: Not on file    Relationship status: Not on file  . Intimate partner violence:    Fear of current or ex partner: Not on file    Emotionally abused: Not on file     Physically abused: Not on file    Forced sexual activity: Not on file  Other Topics Concern  . Not on file  Social History Narrative   Marital status/children/pets: married, 1 child.    Education/employment: B.S. Degree, works in [employer].   Safety:      -Wears a bicycle helmet riding a bike: No     -smoke alarm in the home:Yes     - wears seatbelt: Yes     - Feels safe in their relationships: Yes   Allergies as of 11/27/2018   No Known Allergies     Medication List       Accurate as of November 27, 2018 11:59 PM. Always use your most recent med list.        amphetamine-dextroamphetamine 20 MG 24 hr capsule Commonly known as:  ADDERALL XR Take 1 capsule (20 mg total) by mouth every morning.   fluticasone 50 MCG/ACT nasal spray Commonly known as:  FLONASE Place into both nostrils daily.   loratadine 10 MG tablet Commonly known as:  CLARITIN Take 10 mg by mouth daily.       All past medical history, surgical history, allergies, family history, immunizations andmedications were updated in the EMR today and reviewed under the history and medication portions of their EMR.     Patient was never admitted.   ROS: 14 pt review of systems performed and negative (unless mentioned in an HPI)  Objective: BP 120/84 (BP Location: Left Arm, Patient Position: Sitting, Cuff Size: Normal)   Pulse 83   Temp 98 F (36.7 C) (Oral)   Resp 16   Ht 5\' 5"  (1.651 m)   Wt 195 lb 2 oz (88.5 kg)   LMP 11/20/2018 (Exact Date)   SpO2 100%   BMI 32.47 kg/m  Gen: Afebrile. No acute distress.  HENT: AT. [place]. MMM.  Eyes:Pupils Equal Round Reactive to light, Extraocular movements intact,  Conjunctiva without redness, discharge or icterus. Neck/lymp/endocrine: Supple,no lymphadenopathy, no thyromegaly CV: RRR no murmur, no edema, +2/4 P posterior tibialis pulses Chest: CTAB, no wheeze or crackles.  Neuro:  Normal gait. PERLA. EOMi. Alert. Oriented x3  Psych: Normal affect, dress and  demeanor. Normal speech. Normal thought content and judgment.  Assessment/plan: Larrisa Cravey is a 33 y.o. female present for establish care with ADD complaints.  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   Indication: ADD Medication and dose: Adderall XR 20 mg QD # pills per month: 30 Last UDS date: 87/2019 contract signed (Y/N):  y- 05/2018 Date narcotic database last reviewed (include red flags): 11/28/18  Refills provided today for 3 mos - follow up 3 mos- end of April    Return in about 3 months (around 02/26/2019) for ADD.   AVS could not be completed- epic down.   Note is dictated utilizing voice recognition software. Although note has been proof read prior to signing, occasional typographical errors still can be missed. If any questions arise, please do not hesitate to call for verification.  Electronically signed by: Felix Pacinienee Kuneff, DO [facility]

## 2018-11-28 ENCOUNTER — Encounter: Payer: Self-pay | Admitting: Family Medicine

## 2018-11-28 MED ORDER — AMPHETAMINE-DEXTROAMPHET ER 20 MG PO CP24: 20.0000 mg | ORAL_CAPSULE | ORAL | 0 refills | Status: DC

## 2018-11-28 NOTE — Patient Instructions (Signed)
Epic down- no avs available.

## 2019-02-13 ENCOUNTER — Other Ambulatory Visit: Payer: Self-pay

## 2019-02-13 ENCOUNTER — Ambulatory Visit (INDEPENDENT_AMBULATORY_CARE_PROVIDER_SITE_OTHER): Payer: Commercial Managed Care - PPO | Admitting: Family Medicine

## 2019-02-13 ENCOUNTER — Encounter: Payer: Self-pay | Admitting: Family Medicine

## 2019-02-13 VITALS — HR 90 | Ht 65.0 in

## 2019-02-13 DIAGNOSIS — F988 Other specified behavioral and emotional disorders with onset usually occurring in childhood and adolescence: Secondary | ICD-10-CM | POA: Diagnosis not present

## 2019-02-13 MED ORDER — AMPHETAMINE-DEXTROAMPHET ER 20 MG PO CP24: 20.0000 mg | ORAL_CAPSULE | ORAL | 0 refills | Status: DC

## 2019-02-13 NOTE — Patient Instructions (Signed)
Follow-up in 3 months for refills.

## 2019-02-13 NOTE — Progress Notes (Signed)
Virtual Visit via Video   I connected with April Bernard on 02/13/19 at  1:40 PM EDT by a video enabled telemedicine application and verified that I am speaking with the correct person using two identifiers. Location patient: Home Location provider: [facility], Office Persons participating in the virtual visit: Patient, Dr. Claiborne Billings and R.Baker, LPN  I discussed the limitations of evaluation and management by telemedicine and the availability of in person appointments. The patient expressed understanding and agreed to proceed.  Subjective:   Chief Complaint  Patient presents with  . ADD    No complaints or concerns. Needs refills. No way to check vital signs at home.     HPI:  ADD:  Pt reports she was diagnosed with ADD in middle school. She has been on Adderall off and on over the years, with discontinuation in high school for a little while and when she lost insurance. She was established with a psychiatrist in 2013 that restarted the medication for her. Her PCP took over in 2015. She continued to take the medication until she became pregnant. We took over management of her ADD on establishment.  Patient reports compliance with Adderall X are 20 mg daily, she is doing well.  She has no complaints.  She would like refills on this medication today. Indication: ADD Medication and dose: Adderall XR 20 mg QD # pills per month: 30 Last UDS date: 87/2019 contract signed (Y/N): yes 05/2018 Date narcotic database last reviewed (include red flags): 02/13/19  ROS: See pertinent positives and negatives per HPI.  Patient Active Problem List   Diagnosis Date Noted  . Obesity (BMI 30-39.9) 09/18/2018  . Controlled substance agreement signed 06/06/2018  . Attention deficit disorder (ADD) without hyperactivity 06/06/2018    Social History   Tobacco Use  . Smoking status: Never Smoker  . Smokeless tobacco: Never Used  Substance Use Topics  . Alcohol use: Yes    Current  Outpatient Medications:  .  amphetamine-dextroamphetamine (ADDERALL XR) 20 MG 24 hr capsule, Take 1 capsule (20 mg total) by mouth every morning., Disp: 30 capsule, Rfl: 0 .  loratadine (CLARITIN) 10 MG tablet, Take 10 mg by mouth daily., Disp: , Rfl:  .  triamcinolone (NASACORT ALLERGY 24HR) 55 MCG/ACT AERO nasal inhaler, Place 2 sprays into the nose daily., Disp: , Rfl:  .  fluticasone (FLONASE) 50 MCG/ACT nasal spray, Place into both nostrils daily., Disp: , Rfl:   No Known Allergies  Objective:  Pulse 90   Ht 5\' 5"  (1.651 m)   LMP 02/10/2019 (Exact Date)   BMI 32.47 kg/m  Gen: No acute distress. Nontoxic in appearance.  HENT: AT. [place].  MMM.  Eyes:Conjunctiva without redness, discharge or icterus. Chest: Cough or shortness of breath not present Neuro:  Normal gait. Alert. Oriented x3  Psych: Normal affect, dress and demeanor. Normal speech. Normal thought content and judgment.   Assessment and Plan:  April Bernard is a 33 y.o. 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  Insurance required 30 d scripts only.   Indication: ADD Medication and dose: Adderall XR 20 mg QD # pills per month: 30 Last UDS date: 87/2019 contract signed (Y/N): y- 05/2018 Date narcotic database last reviewed (include red flags): 02/13/19 Refills provided today for 3 mos - follow up 3 mos- end of June-July  > 15 minutes spent with patient, >50% of that time spent face-to-face counseling  Felix Pacini, DO 02/13/2019

## 2019-05-13 ENCOUNTER — Encounter (INDEPENDENT_AMBULATORY_CARE_PROVIDER_SITE_OTHER): Payer: Self-pay | Admitting: Advanced Practice Midwife

## 2019-05-20 ENCOUNTER — Encounter: Payer: Self-pay | Admitting: Family Medicine

## 2019-05-20 ENCOUNTER — Ambulatory Visit (INDEPENDENT_AMBULATORY_CARE_PROVIDER_SITE_OTHER): Payer: Commercial Managed Care - PPO | Admitting: Family Medicine

## 2019-05-20 ENCOUNTER — Other Ambulatory Visit: Payer: Self-pay

## 2019-05-20 VITALS — Temp 97.7°F | Ht 65.0 in | Wt 193.0 lb

## 2019-05-20 DIAGNOSIS — F988 Other specified behavioral and emotional disorders with onset usually occurring in childhood and adolescence: Secondary | ICD-10-CM

## 2019-05-20 MED ORDER — AMPHETAMINE-DEXTROAMPHET ER 20 MG PO CP24: 20.0000 mg | ORAL_CAPSULE | ORAL | 0 refills | Status: DC

## 2019-05-20 NOTE — Progress Notes (Signed)
I have discussed the procedure for the virtual visit with the patient who has given consent to proceed with assessment and treatment.   BETHANY DILLARD, CMA     

## 2019-05-20 NOTE — Progress Notes (Signed)
Virtual Visit via Video   I connected with April Bernard on 05/20/19 at 11:30 AM EDT by a video enabled telemedicine application and verified that I am speaking with the correct person using two identifiers. Location patient: Home Location provider: [facility], Office Persons participating in the virtual visit: Patient, Dr. Claiborne BillingsKuneff and R.Baker, LPN  I discussed the limitations of evaluation and management by telemedicine and the availability of in person appointments. The patient expressed understanding and agreed to proceed.  Subjective:   Chief Complaint  Patient presents with  . Medication Management    Needs refill on Adderall.  States that everything is great she is having no issues.     HPI:  ADD:  Pt reports she was diagnosed with ADD in middle school. She has been on Adderall off and on over the years, with discontinuation in high school for a little while and when she lost insurance. She was established with a psychiatrist in 2013 that restarted the medication for her. Her PCP took over in 2015. She continued to take the medication until she became pregnant. We took over management of her ADD on establishment.  Patient reports compliance with Adderall X are 20 mg daily, she is doing well.  She has no complaints.  She would like refills on this medication today. Indication: ADD Medication and dose: Adderall XR 20 mg QD # pills per month: 30 Last UDS date: 87/2019- next visit in person update contract signed (Y/N): yes 05/2018-next visit in person update Date narcotic database last reviewed (include red flags): 05/20/19  ROS: See pertinent positives and negatives per HPI.  Patient Active Problem List   Diagnosis Date Noted  . Obesity (BMI 30-39.9) 09/18/2018  . Controlled substance agreement signed 06/06/2018  . Attention deficit disorder (ADD) without hyperactivity 06/06/2018    Social History   Tobacco Use  . Smoking status: Never Smoker  . Smokeless  tobacco: Never Used  Substance Use Topics  . Alcohol use: Yes    Current Outpatient Medications:  .  amphetamine-dextroamphetamine (ADDERALL XR) 20 MG 24 hr capsule, Take 1 capsule (20 mg total) by mouth every morning., Disp: 30 capsule, Rfl: 0 .  fluticasone (FLONASE) 50 MCG/ACT nasal spray, Place into both nostrils daily., Disp: , Rfl:  .  loratadine (CLARITIN) 10 MG tablet, Take 10 mg by mouth daily., Disp: , Rfl:   No Known Allergies  Objective:  Temp 97.7 F (36.5 C)   Ht 5\' 5"  (1.651 m)   Wt 193 lb (87.5 kg)   BMI 32.12 kg/m  Gen: Afebrile. No acute distress.  HENT: AT. [place].  MMM.  Eyes:Pupils Equal Round Reactive to light, Extraocular movements intact,  Conjunctiva without redness, discharge or icterus. Chest: no cough or shortness of breath Neuro: Normal gait. PERLA. EOMi. Alert. Oriented x3  Psych: Normal affect, dress and demeanor. Normal speech. Normal thought content and judgment..   Assessment and Plan:  April Bernard is a 33 y.o. female present for ADD follow up Attention deficit disorder (ADD) without hyperactivity - stable condition.  Insurance required 30 d scripts only.   Indication: ADD Medication and dose: Adderall XR 20 mg QD # pills per month: 30 Last UDS date: 87/2019- update next in person visit contract signed (Y/N): y- 05/2018-update next in person visit Date narcotic database last reviewed (include red flags): 05/20/19 Refills provided today for 3 mos - follow up 3 mos  > 15 minutes spent with patient, >50% of that time spent face-to-face counseling  Howard Pouch, DO 05/20/2019

## 2019-08-06 LAB — OB RESULTS CONSOLE RPR: RPR: NONREACTIVE

## 2019-08-06 LAB — OB RESULTS CONSOLE RUBELLA ANTIBODY, IGM: Rubella: IMMUNE

## 2019-08-06 LAB — OB RESULTS CONSOLE ABO/RH: RH Type: POSITIVE

## 2019-08-06 LAB — OB RESULTS CONSOLE ANTIBODY SCREEN: Antibody Screen: NEGATIVE

## 2019-08-06 LAB — OB RESULTS CONSOLE HIV ANTIBODY (ROUTINE TESTING): HIV: NONREACTIVE

## 2019-08-06 LAB — OB RESULTS CONSOLE HEPATITIS B SURFACE ANTIGEN: Hepatitis B Surface Ag: NEGATIVE

## 2020-01-05 ENCOUNTER — Ambulatory Visit: Payer: Commercial Managed Care - PPO | Attending: Internal Medicine

## 2020-01-05 DIAGNOSIS — Z23 Encounter for immunization: Secondary | ICD-10-CM | POA: Insufficient documentation

## 2020-01-05 NOTE — Progress Notes (Signed)
   Covid-19 Vaccination Clinic  Name:  April Bernard    MRN: [id number] DOB: August 09, 1986  01/05/2020  [name] was observed post Covid-19 immunization for 15 minutes without incident. She was provided with Vaccine Information Sheet and instruction to access the V-Safe system.   [name] was instructed to call 911 with any severe reactions post vaccine: Marland Kitchen Difficulty breathing  . Swelling of face and throat  . A fast heartbeat  . A bad rash all over body  . Dizziness and weakness   Immunizations Administered    Name Date Dose VIS Date Route   Pfizer COVID-19 Vaccine 01/05/2020  7:06 PM 0.3 mL 10/11/2019 Intramuscular   Manufacturer: [employer], [employer]   Lot: [id number]   NDC: [address]-7

## 2020-02-05 ENCOUNTER — Ambulatory Visit: Payer: Commercial Managed Care - PPO | Attending: Internal Medicine

## 2020-02-05 DIAGNOSIS — Z23 Encounter for immunization: Secondary | ICD-10-CM

## 2020-02-05 NOTE — Progress Notes (Signed)
   Covid-19 Vaccination Clinic  Name:  April Bernard    MRN: [id number] DOB: 21-Feb-1986  02/05/2020  April Bernard was observed post Covid-19 immunization for 15 minutes without incident. She was provided with Vaccine Information Sheet and instruction to access the V-Safe system.   April Bernard was instructed to call 911 with any severe reactions post vaccine: Marland Kitchen Difficulty breathing  . Swelling of face and throat  . A fast heartbeat  . A bad rash all over body  . Dizziness and weakness   Immunizations Administered    Name Date Dose VIS Date Route   Pfizer COVID-19 Vaccine 02/05/2020  9:50 AM 0.3 mL 10/11/2019 Intramuscular   Manufacturer: [employer], [employer]   Lot: [id number]   NDC: [address]-0

## 2020-02-19 ENCOUNTER — Other Ambulatory Visit: Payer: Self-pay | Admitting: Obstetrics and Gynecology

## 2020-02-25 ENCOUNTER — Encounter (HOSPITAL_COMMUNITY): Payer: Self-pay

## 2020-02-25 ENCOUNTER — Encounter (HOSPITAL_COMMUNITY): Payer: Self-pay | Admitting: *Deleted

## 2020-02-25 NOTE — Patient Instructions (Signed)
April Bernard  02/25/2020   Your procedure is scheduled on:  03/04/2020  Arrive at 0630 at [facility] on [address] at [facility]  and [address]. You are invited to use the FREE valet parking or use the Visitor's parking deck.  Pick up the phone at the desk and dial [phone].  Call this number if you have problems the morning of surgery: [phone]  Remember:   Do not eat food:(After Midnight) Desps de medianoche.  Do not drink clear liquids: (After Midnight) Desps de medianoche.  Take these medicines the morning of surgery with A SIP OF WATER:  none   Do not wear jewelry, make-up or nail polish.  Do not wear lotions, powders, or perfumes. Do not wear deodorant.  Do not shave 48 hours prior to surgery.  Do not bring valuables to the hospital.  [facility] is not   responsible for any belongings or valuables brought to the hospital.  Contacts, dentures or bridgework may not be worn into surgery.  Leave suitcase in the car. After surgery it may be brought to your room.  For patients admitted to the hospital, checkout time is 11:00 AM the day of              discharge.      Please read over the following fact sheets that you were given:     Preparing for Surgery

## 2020-03-02 ENCOUNTER — Other Ambulatory Visit: Payer: Self-pay

## 2020-03-02 ENCOUNTER — Other Ambulatory Visit (HOSPITAL_COMMUNITY)
Admission: RE | Admit: 2020-03-02 | Discharge: 2020-03-02 | Disposition: A | Discharge status: Home / Self Care | Payer: Commercial Managed Care - PPO | Source: Ambulatory Visit | Attending: Obstetrics and Gynecology | Admitting: Obstetrics and Gynecology

## 2020-03-02 DIAGNOSIS — Z20822 Contact with and (suspected) exposure to covid-19: Secondary | ICD-10-CM | POA: Insufficient documentation

## 2020-03-02 DIAGNOSIS — Z01812 Encounter for preprocedural laboratory examination: Secondary | ICD-10-CM | POA: Insufficient documentation

## 2020-03-02 HISTORY — DX: Unspecified abnormal cytological findings in specimens from vagina: R87.629

## 2020-03-02 LAB — CBC
HCT: 34.6 % — ABNORMAL LOW (ref 36.0–46.0)
Hemoglobin: 11 g/dL — ABNORMAL LOW (ref 12.0–15.0)
MCH: 25.9 pg — ABNORMAL LOW (ref 26.0–34.0)
MCHC: 31.8 g/dL (ref 30.0–36.0)
MCV: 81.6 fL (ref 80.0–100.0)
Platelets: 152 10*3/uL (ref 150–400)
RBC: 4.24 MIL/uL (ref 3.87–5.11)
RDW: 14.5 % (ref 11.5–15.5)
WBC: 8.3 10*3/uL (ref 4.0–10.5)
nRBC: 0 % (ref 0.0–0.2)

## 2020-03-02 LAB — ABO/RH: ABO/RH(D): A POS

## 2020-03-02 LAB — SARS CORONAVIRUS 2 (TAT 6-24 HRS): SARS Coronavirus 2: NEGATIVE

## 2020-03-02 LAB — RPR: RPR Ser Ql: NONREACTIVE

## 2020-03-02 NOTE — MAU Note (Signed)
Pt here for lab draw and covid swab. Denies symptoms or sick contacts. Swab collected.  

## 2020-03-03 LAB — TYPE AND SCREEN
ABO/RH(D): A POS
Antibody Screen: NEGATIVE

## 2020-03-04 ENCOUNTER — Inpatient Hospital Stay (HOSPITAL_COMMUNITY): Payer: Commercial Managed Care - PPO | Admitting: Anesthesiology

## 2020-03-04 ENCOUNTER — Inpatient Hospital Stay (HOSPITAL_COMMUNITY)
Admission: RE | Admit: 2020-03-04 | Discharge: 2020-03-06 | DRG: 787 | Disposition: A | Discharge status: Home / Self Care | Payer: Commercial Managed Care - PPO | Attending: Obstetrics and Gynecology | Admitting: Obstetrics and Gynecology

## 2020-03-04 ENCOUNTER — Other Ambulatory Visit: Payer: Self-pay

## 2020-03-04 ENCOUNTER — Encounter (HOSPITAL_COMMUNITY): Payer: Self-pay | Admitting: Obstetrics and Gynecology

## 2020-03-04 ENCOUNTER — Encounter (HOSPITAL_COMMUNITY)
Admission: RE | Disposition: A | Discharge status: Home / Self Care | Payer: Self-pay | Source: Home / Self Care | Attending: Obstetrics and Gynecology

## 2020-03-04 DIAGNOSIS — Z3A39 39 weeks gestation of pregnancy: Secondary | ICD-10-CM | POA: Diagnosis not present

## 2020-03-04 DIAGNOSIS — Z20822 Contact with and (suspected) exposure to covid-19: Secondary | ICD-10-CM | POA: Diagnosis present

## 2020-03-04 DIAGNOSIS — O34211 Maternal care for low transverse scar from previous cesarean delivery: Secondary | ICD-10-CM | POA: Diagnosis present

## 2020-03-04 DIAGNOSIS — D62 Acute posthemorrhagic anemia: Secondary | ICD-10-CM | POA: Diagnosis not present

## 2020-03-04 DIAGNOSIS — O9081 Anemia of the puerperium: Secondary | ICD-10-CM | POA: Diagnosis not present

## 2020-03-04 DIAGNOSIS — O34219 Maternal care for unspecified type scar from previous cesarean delivery: Secondary | ICD-10-CM | POA: Diagnosis present

## 2020-03-04 DIAGNOSIS — Z98891 History of uterine scar from previous surgery: Secondary | ICD-10-CM

## 2020-03-04 SURGERY — Surgical Case
Anesthesia: Spinal

## 2020-03-04 MED ORDER — TETANUS-DIPHTH-ACELL PERTUSSIS 5-2.5-18.5 LF-MCG/0.5 IM SUSP: 0.5000 mL | Freq: Once | INTRAMUSCULAR | Status: DC

## 2020-03-04 MED ORDER — MENTHOL 3 MG MT LOZG: 1.0000 | LOZENGE | OROMUCOSAL | Status: DC | PRN

## 2020-03-04 MED ORDER — SIMETHICONE 80 MG PO CHEW
80.0000 mg | CHEWABLE_TABLET | Freq: Three times a day (TID) | ORAL | Status: DC
  Administered 2020-03-04 – 2020-03-06 (×5): 80 mg via ORAL
  Filled 2020-03-04 (×5): qty 1

## 2020-03-04 MED ORDER — OXYTOCIN 40 UNITS IN NORMAL SALINE INFUSION - SIMPLE MED
INTRAVENOUS | Status: AC
  Filled 2020-03-04: qty 1000

## 2020-03-04 MED ORDER — COCONUT OIL OIL
1.0000 "application " | TOPICAL_OIL | Status: DC | PRN
  Administered 2020-03-04: 1 via TOPICAL

## 2020-03-04 MED ORDER — SODIUM CHLORIDE 0.9 % IV SOLN: INTRAVENOUS | Status: DC | PRN

## 2020-03-04 MED ORDER — OXYCODONE-ACETAMINOPHEN 5-325 MG PO TABS
1.0000 | ORAL_TABLET | ORAL | Status: DC | PRN
  Administered 2020-03-04: 23:00:00 1 via ORAL
  Administered 2020-03-05 – 2020-03-06 (×6): 2 via ORAL
  Filled 2020-03-04: qty 1
  Filled 2020-03-04 (×7): qty 2

## 2020-03-04 MED ORDER — OXYCODONE HCL 5 MG/5ML PO SOLN: 5.0000 mg | Freq: Once | ORAL | Status: DC | PRN

## 2020-03-04 MED ORDER — FENTANYL CITRATE (PF) 100 MCG/2ML IJ SOLN
INTRAMUSCULAR | Status: DC | PRN
  Administered 2020-03-04: 15 ug via INTRATHECAL

## 2020-03-04 MED ORDER — FENTANYL CITRATE (PF) 100 MCG/2ML IJ SOLN
INTRAMUSCULAR | Status: AC
  Filled 2020-03-04: qty 2

## 2020-03-04 MED ORDER — CEFAZOLIN SODIUM-DEXTROSE 2-4 GM/100ML-% IV SOLN: 2.0000 g | INTRAVENOUS | Status: DC

## 2020-03-04 MED ORDER — PRENATAL MULTIVITAMIN CH
1.0000 | ORAL_TABLET | Freq: Every day | ORAL | Status: DC
  Administered 2020-03-05 – 2020-03-06 (×2): 1 via ORAL
  Filled 2020-03-04 (×2): qty 1

## 2020-03-04 MED ORDER — WITCH HAZEL-GLYCERIN EX PADS: 1.0000 "application " | MEDICATED_PAD | CUTANEOUS | Status: DC | PRN

## 2020-03-04 MED ORDER — ONDANSETRON HCL 4 MG/2ML IJ SOLN
INTRAMUSCULAR | Status: DC | PRN
  Administered 2020-03-04: 4 mg via INTRAVENOUS

## 2020-03-04 MED ORDER — CEFAZOLIN SODIUM-DEXTROSE 2-4 GM/100ML-% IV SOLN
INTRAVENOUS | Status: AC
  Filled 2020-03-04: qty 100

## 2020-03-04 MED ORDER — LACTATED RINGERS IV SOLN: INTRAVENOUS | Status: DC

## 2020-03-04 MED ORDER — LACTATED RINGERS IV SOLN: INTRAVENOUS | Status: DC | PRN

## 2020-03-04 MED ORDER — ONDANSETRON HCL 4 MG/2ML IJ SOLN
INTRAMUSCULAR | Status: AC
  Filled 2020-03-04: qty 2

## 2020-03-04 MED ORDER — KETOROLAC TROMETHAMINE 30 MG/ML IJ SOLN
30.0000 mg | Freq: Four times a day (QID) | INTRAMUSCULAR | Status: AC
  Administered 2020-03-04 – 2020-03-05 (×3): 30 mg via INTRAVENOUS
  Filled 2020-03-04 (×3): qty 1

## 2020-03-04 MED ORDER — ZOLPIDEM TARTRATE 5 MG PO TABS: 5.0000 mg | ORAL_TABLET | Freq: Every evening | ORAL | Status: DC | PRN

## 2020-03-04 MED ORDER — DIBUCAINE (PERIANAL) 1 % EX OINT: 1.0000 "application " | TOPICAL_OINTMENT | CUTANEOUS | Status: DC | PRN

## 2020-03-04 MED ORDER — BUPIVACAINE IN DEXTROSE 0.75-8.25 % IT SOLN
INTRATHECAL | Status: DC | PRN
  Administered 2020-03-04: 1.6 mL via INTRATHECAL

## 2020-03-04 MED ORDER — IBUPROFEN 800 MG PO TABS
800.0000 mg | ORAL_TABLET | Freq: Four times a day (QID) | ORAL | Status: DC
  Administered 2020-03-05 – 2020-03-06 (×5): 800 mg via ORAL
  Filled 2020-03-04 (×5): qty 1

## 2020-03-04 MED ORDER — PHENYLEPHRINE 40 MCG/ML (10ML) SYRINGE FOR IV PUSH (FOR BLOOD PRESSURE SUPPORT)
PREFILLED_SYRINGE | INTRAVENOUS | Status: AC
  Filled 2020-03-04: qty 10

## 2020-03-04 MED ORDER — PHENYLEPHRINE HCL-NACL 20-0.9 MG/250ML-% IV SOLN
INTRAVENOUS | Status: AC
  Filled 2020-03-04: qty 250

## 2020-03-04 MED ORDER — DIPHENHYDRAMINE HCL 25 MG PO CAPS: 25.0000 mg | ORAL_CAPSULE | Freq: Four times a day (QID) | ORAL | Status: DC | PRN

## 2020-03-04 MED ORDER — METHYLERGONOVINE MALEATE 0.2 MG/ML IJ SOLN: 0.2000 mg | INTRAMUSCULAR | Status: DC | PRN

## 2020-03-04 MED ORDER — BUPIVACAINE HCL (PF) 0.25 % IJ SOLN
INTRAMUSCULAR | Status: AC
  Filled 2020-03-04: qty 30

## 2020-03-04 MED ORDER — MORPHINE SULFATE (PF) 0.5 MG/ML IJ SOLN
INTRAMUSCULAR | Status: DC | PRN
  Administered 2020-03-04: 150 ug via INTRATHECAL

## 2020-03-04 MED ORDER — METHYLERGONOVINE MALEATE 0.2 MG PO TABS: 0.2000 mg | ORAL_TABLET | ORAL | Status: DC | PRN

## 2020-03-04 MED ORDER — FENTANYL CITRATE (PF) 100 MCG/2ML IJ SOLN: 25.0000 ug | INTRAMUSCULAR | Status: DC | PRN

## 2020-03-04 MED ORDER — ONDANSETRON HCL 4 MG/2ML IJ SOLN: 4.0000 mg | Freq: Once | INTRAMUSCULAR | Status: DC | PRN

## 2020-03-04 MED ORDER — PHENYLEPHRINE HCL-NACL 20-0.9 MG/250ML-% IV SOLN
INTRAVENOUS | Status: DC | PRN
  Administered 2020-03-04: 60 ug/min via INTRAVENOUS

## 2020-03-04 MED ORDER — SIMETHICONE 80 MG PO CHEW
80.0000 mg | CHEWABLE_TABLET | ORAL | Status: DC
  Administered 2020-03-05 (×2): 80 mg via ORAL
  Filled 2020-03-04 (×2): qty 1

## 2020-03-04 MED ORDER — KETOROLAC TROMETHAMINE 30 MG/ML IJ SOLN
30.0000 mg | Freq: Once | INTRAMUSCULAR | Status: AC | PRN
  Administered 2020-03-04: 10:00:00 30 mg via INTRAVENOUS

## 2020-03-04 MED ORDER — DEXAMETHASONE SODIUM PHOSPHATE 4 MG/ML IJ SOLN
INTRAMUSCULAR | Status: AC
  Filled 2020-03-04: qty 7

## 2020-03-04 MED ORDER — SCOPOLAMINE 1 MG/3DAYS TD PT72
MEDICATED_PATCH | TRANSDERMAL | Status: AC
  Filled 2020-03-04: qty 1

## 2020-03-04 MED ORDER — SENNOSIDES-DOCUSATE SODIUM 8.6-50 MG PO TABS
2.0000 | ORAL_TABLET | ORAL | Status: DC
  Administered 2020-03-05 (×2): 2 via ORAL
  Filled 2020-03-04 (×2): qty 2

## 2020-03-04 MED ORDER — METOCLOPRAMIDE HCL 5 MG/ML IJ SOLN
INTRAMUSCULAR | Status: AC
  Filled 2020-03-04: qty 2

## 2020-03-04 MED ORDER — BUPIVACAINE HCL (PF) 0.25 % IJ SOLN
INTRAMUSCULAR | Status: DC | PRN
  Administered 2020-03-04: 20 mL

## 2020-03-04 MED ORDER — KETOROLAC TROMETHAMINE 30 MG/ML IJ SOLN
INTRAMUSCULAR | Status: AC
  Filled 2020-03-04: qty 1

## 2020-03-04 MED ORDER — SIMETHICONE 80 MG PO CHEW: 80.0000 mg | CHEWABLE_TABLET | ORAL | Status: DC | PRN

## 2020-03-04 MED ORDER — CEFAZOLIN SODIUM-DEXTROSE 2-3 GM-%(50ML) IV SOLR
INTRAVENOUS | Status: DC | PRN
  Administered 2020-03-04: 2 g via INTRAVENOUS

## 2020-03-04 MED ORDER — MORPHINE SULFATE (PF) 0.5 MG/ML IJ SOLN
INTRAMUSCULAR | Status: AC
  Filled 2020-03-04: qty 10

## 2020-03-04 MED ORDER — OXYTOCIN 40 UNITS IN NORMAL SALINE INFUSION - SIMPLE MED: 2.5000 [IU]/h | INTRAVENOUS | Status: AC

## 2020-03-04 MED ORDER — OXYTOCIN 10 UNIT/ML IJ SOLN
INTRAMUSCULAR | Status: DC | PRN
  Administered 2020-03-04: 40 [IU]

## 2020-03-04 MED ORDER — OXYCODONE HCL 5 MG PO TABS: 5.0000 mg | ORAL_TABLET | Freq: Once | ORAL | Status: DC | PRN

## 2020-03-04 MED ORDER — DEXAMETHASONE SODIUM PHOSPHATE 4 MG/ML IJ SOLN
INTRAMUSCULAR | Status: DC | PRN
  Administered 2020-03-04: 8 mg via INTRAVENOUS

## 2020-03-04 SURGICAL SUPPLY — 38 items
BENZOIN TINCTURE PRP APPL 2/3 (GAUZE/BANDAGES/DRESSINGS) ×1 IMPLANT
CHLORAPREP W/TINT 26ML (MISCELLANEOUS) ×2 IMPLANT
CLAMP CORD UMBIL (MISCELLANEOUS) IMPLANT
CLOTH BEACON ORANGE TIMEOUT ST (SAFETY) ×2 IMPLANT
DRSG OPSITE POSTOP 4X10 (GAUZE/BANDAGES/DRESSINGS) ×2 IMPLANT
ELECT REM PT RETURN 9FT ADLT (ELECTROSURGICAL) ×2
ELECTRODE REM PT RTRN 9FT ADLT (ELECTROSURGICAL) ×1 IMPLANT
EXTRACTOR VACUUM M CUP 4 TUBE (SUCTIONS) IMPLANT
GLOVE BIO SURGEON STRL SZ7.5 (GLOVE) ×2 IMPLANT
GLOVE BIOGEL PI IND STRL 7.0 (GLOVE) ×1 IMPLANT
GLOVE BIOGEL PI INDICATOR 7.0 (GLOVE) ×1
GOWN STRL REUS W/TWL LRG LVL3 (GOWN DISPOSABLE) ×4 IMPLANT
KIT ABG SYR 3ML LUER SLIP (SYRINGE) IMPLANT
NDL HYPO 25X5/8 SAFETYGLIDE (NEEDLE) IMPLANT
NDL SPNL 20GX3.5 QUINCKE YW (NEEDLE) IMPLANT
NEEDLE HYPO 22GX1.5 SAFETY (NEEDLE) ×2 IMPLANT
NEEDLE HYPO 25X5/8 SAFETYGLIDE (NEEDLE) IMPLANT
NEEDLE SPNL 20GX3.5 QUINCKE YW (NEEDLE) IMPLANT
NS IRRIG 1000ML POUR BTL (IV SOLUTION) ×2 IMPLANT
PACK C SECTION WH (CUSTOM PROCEDURE TRAY) ×2 IMPLANT
PAD ABD 7.5X8 STRL (GAUZE/BANDAGES/DRESSINGS) ×1 IMPLANT
PENCIL SMOKE EVAC W/HOLSTER (ELECTROSURGICAL) ×2 IMPLANT
SPONGE GAUZE 4X4 12PLY STER LF (GAUZE/BANDAGES/DRESSINGS) ×2 IMPLANT
STRIP CLOSURE SKIN 1/2X4 (GAUZE/BANDAGES/DRESSINGS) ×1 IMPLANT
SUT MNCRL 0 VIOLET CTX 36 (SUTURE) ×2 IMPLANT
SUT MNCRL AB 3-0 PS2 27 (SUTURE) IMPLANT
SUT MON AB 2-0 CT1 27 (SUTURE) ×2 IMPLANT
SUT MON AB-0 CT1 36 (SUTURE) ×4 IMPLANT
SUT MONOCRYL 0 CTX 36 (SUTURE) ×2
SUT PLAIN 0 NONE (SUTURE) IMPLANT
SUT PLAIN 2 0 (SUTURE)
SUT PLAIN 2 0 XLH (SUTURE) ×1 IMPLANT
SUT PLAIN ABS 2-0 CT1 27XMFL (SUTURE) IMPLANT
SYR 20CC LL (SYRINGE) IMPLANT
SYR CONTROL 10ML LL (SYRINGE) ×2 IMPLANT
TOWEL OR 17X24 6PK STRL BLUE (TOWEL DISPOSABLE) ×2 IMPLANT
TRAY FOLEY W/BAG SLVR 14FR LF (SET/KITS/TRAYS/PACK) ×2 IMPLANT
WATER STERILE IRR 1000ML POUR (IV SOLUTION) ×2 IMPLANT

## 2020-03-04 NOTE — H&P (Addendum)
April Bernard is a 34 y.o. female presenting for rpt csection.History of PEC. OB History    Gravida  2   Para  1   Term  1   Preterm      AB      Living  1     SAB      TAB      Ectopic      Multiple      Live Births  1          Past Medical History:  Diagnosis Date  . ADD (attention deficit disorder)   . Allergic rhinitis   . Chicken pox   . Depression   . Ganglion cyst of right foot   . History of pregnancy induced hypertension   . Preeclampsia    2018  . Vaginal Pap smear, abnormal    Past Surgical History:  Procedure Laterality Date  . CESAREAN SECTION  11/08/2016  . REFRACTIVE SURGERY Bilateral 2011   Family History: family history includes Arthritis in her maternal grandmother and mother; Breast cancer in her paternal grandmother; Diabetes in her maternal grandfather; Heart disease in her maternal grandfather; Hyperlipidemia in her father, paternal grandfather, and paternal grandmother; Hypertension in her maternal grandfather, maternal grandmother, mother, paternal grandfather, and paternal grandmother; Prostate cancer in her paternal grandfather. Social History:  reports that she has never smoked. She has never used smokeless tobacco. She reports current alcohol use. She reports that she does not use drugs.     Maternal Diabetes: No Genetic Screening: Normal Maternal Ultrasounds/Referrals: Normal Fetal Ultrasounds or other Referrals:  None Maternal Substance Abuse:  No Significant Maternal Medications:  None Significant Maternal Lab Results:  Group B Strep negative Other Comments:  None  Review of Systems  Constitutional: Negative.   All other systems reviewed and are negative.    There were no vitals taken for this visit. Maternal Exam:  Uterine Assessment: Contraction strength is mild.  Contraction frequency is rare.   Abdomen: Patient reports no abdominal tenderness. Surgical scars: low transverse.   Fetal presentation:  vertex  Introitus: Normal vulva. Normal vagina.  Ferning test: not done.  Nitrazine test: not done. Amniotic fluid character: not assessed.  Pelvis: questionable for delivery.   Cervix: Cervix evaluated by digital exam.     Physical Exam  Nursing note and vitals reviewed. Constitutional: She is oriented to person, place, and time. She appears well-developed and well-nourished.  HENT:  Head: Normocephalic and atraumatic.  Cardiovascular: Normal rate and regular rhythm.  Respiratory: Effort normal and breath sounds normal.  GI: Soft. Bowel sounds are normal.  Genitourinary:    Vulva, vagina and uterus normal.   Musculoskeletal:        General: Normal range of motion.     Cervical back: Normal range of motion and neck supple.  Neurological: She is alert and oriented to person, place, and time. She has normal reflexes.  Skin: Skin is warm and dry.  Psychiatric: She has a normal mood and affect.    Prenatal labs: ABO, Rh: --/--/A POS, A POS Performed at [facility], [address], [place], [place] [address]  [phone]/03 0830) Antibody: NEG (05/03 0830) Rubella: Immune (10/06 0000) RPR: NON REACTIVE (05/03 0831)  HBsAg: Negative (10/06 0000)  HIV: Non-reactive (10/06 0000)  GBS:   neg  Assessment/Plan: 39wk IUP History of PEC Previous csection for rpt. Surgical risks discussed. Consent done.   April Bernard J 03/04/2020, 6:42 AM

## 2020-03-04 NOTE — Lactation Note (Signed)
This note was copied from a baby's chart. Lactation Consultation Note  Patient Name: April Bernard [id number] Date: 03/04/2020 Reason for consult: Initial assessment  LC and [place] student met w/ MOB and FOB for initial assessment.  Baby April was starting to stir in her bassinet as we entered.    MOB is a G2P2 parent who delivered baby April via CS.  Baby April is only 6hrs old.  Mom did have breast changes during her pregnancy.  She has not had any surgeries or augementation of her breast.  MOB currently has a Spectra pump. She breastfed her first baby for 8 months but did have complications with low supply.  Her first child had a tongue revision at 36 weeks old in [place] with the ENT.  Mom of baby asked LC and [place] student if her daughter had a tongue-tie.   [place] student and Drew described oral assessment to the MOB and FOB.    Hand expression was taught and re-taught back to student and LC.  Drops of colostrum were seen.  Mom has erect nipples. During the initial assessment, baby April latched very well in laid back position and football hold.  Breastfeeding basics were discussed with parents during the 10 minute feeding.  Audible swallows were heard as well.  Baby April has a thick labial frenulum and a visual lingual frenulum mid tongue. When baby came off nipple, it was rounded with no creases.   Plan:  -Feed w/ cue or 8-12x w/in 24hrs -Continue to do lots of STS      Consult Status Consult Status: Follow-up Date: 03/05/20 Follow-up type: In-patient    Yolanda Bonine. Doreene Forrey 03/04/2020, 4:18 PM

## 2020-03-04 NOTE — Anesthesia Preprocedure Evaluation (Signed)
Anesthesia Evaluation  Patient identified by MRN, date of birth, ID band Patient awake    Reviewed: Allergy & Precautions, H&P , NPO status , Patient's Chart, lab work & pertinent test results  History of Anesthesia Complications Negative for: history of anesthetic complications  Airway Mallampati: II  TM Distance: >3 FB Neck ROM: full    Dental no notable dental hx.    Pulmonary neg pulmonary ROS,    Pulmonary exam normal        Cardiovascular hypertension, negative cardio ROS Normal cardiovascular exam     Neuro/Psych negative neurological ROS  negative psych ROS   GI/Hepatic negative GI ROS, Neg liver ROS,   Endo/Other  negative endocrine ROS  Renal/GU negative Renal ROS  negative genitourinary   Musculoskeletal   Abdominal   Peds  Hematology negative hematology ROS (+)   Anesthesia Other Findings Prior C/S x1  Reproductive/Obstetrics (+) Pregnancy                             Anesthesia Physical Anesthesia Plan  ASA: II  Anesthesia Plan: Spinal   Post-op Pain Management:    Induction:   PONV Risk Score and Plan: Ondansetron and Treatment may vary due to age or medical condition  Airway Management Planned:   Additional Equipment:   Intra-op Plan:   Post-operative Plan:   Informed Consent: I have reviewed the patients History and Physical, chart, labs and discussed the procedure including the risks, benefits and alternatives for the proposed anesthesia with the patient or authorized representative who has indicated his/her understanding and acceptance.       Plan Discussed with:   Anesthesia Plan Comments:         Anesthesia Quick Evaluation

## 2020-03-04 NOTE — Anesthesia Procedure Notes (Signed)
Spinal  Patient location during procedure: OR Staffing Performed: anesthesiologist  Anesthesiologist: Dianca Owensby E, MD Preanesthetic Checklist Completed: patient identified, IV checked, risks and benefits discussed, surgical consent, monitors and equipment checked, pre-op evaluation and timeout performed Spinal Block Patient position: sitting Prep: DuraPrep and site prepped and draped Patient monitoring: continuous pulse ox, blood pressure and heart rate Approach: midline Location: L3-4 Injection technique: single-shot Needle Needle type: Pencan  Needle gauge: 24 G Needle length: 9 cm Additional Notes Functioning IV was confirmed and monitors were applied. Sterile prep and drape, including hand hygiene and sterile gloves were used. The patient was positioned and the spine was prepped. The skin was anesthetized with lidocaine.  Free flow of clear CSF was obtained prior to injecting local anesthetic into the CSF. The needle was carefully withdrawn. The patient tolerated the procedure well.      

## 2020-03-04 NOTE — Transfer of Care (Signed)
Immediate Anesthesia Transfer of Care Note  Patient: April Bernard  Procedure(s) Performed: Repeat CESAREAN SECTION (N/A )  Patient Location: PACU  Anesthesia Type:Spinal  Level of Consciousness: awake, alert  and oriented  Airway & Oxygen Therapy: Patient Spontanous Breathing  Post-op Assessment: Report given to RN and Post -op Vital signs reviewed and stable  Post vital signs: Reviewed and stable  Last Vitals:  Vitals Value Taken Time  BP    Temp    Pulse    Resp    SpO2      Last Pain:  Vitals:   03/04/20 0647  PainSc: 0-No pain         Complications: No apparent anesthesia complications

## 2020-03-04 NOTE — Anesthesia Postprocedure Evaluation (Signed)
Anesthesia Post Note  Patient: April Bernard  Procedure(s) Performed: Repeat CESAREAN SECTION (N/A )     Patient location during evaluation: PACU Anesthesia Type: Spinal Level of consciousness: oriented and awake and alert Pain management: pain level controlled Vital Signs Assessment: post-procedure vital signs reviewed and stable Respiratory status: spontaneous breathing, respiratory function stable and nonlabored ventilation Cardiovascular status: blood pressure returned to baseline and stable Postop Assessment: no headache, no backache, no apparent nausea or vomiting and spinal receding Anesthetic complications: no    Last Vitals:  Vitals:   03/04/20 1030 03/04/20 1045  BP: 124/89 139/84  Pulse: 71 72  Resp: 20 18  Temp: 36.9 C 36.9 C  SpO2: 96% 100%    Last Pain:  Vitals:   03/04/20 1045  TempSrc: Oral  PainSc:    Pain Goal:                   Lucretia Kern

## 2020-03-04 NOTE — Op Note (Signed)
Cesarean Section Procedure Note  Indications: previous uterine incision kerr x one  Pre-operative Diagnosis: 39 week 1 day pregnancy.  Post-operative Diagnosis: same Omenal adhesion and bladder flap adhesions  Surgeon: Lenoard Aden   Assistants: Renae Fickle, CNM  Anesthesia: Local anesthesia 0.25.% bupivacaine and Spinal anesthesia  ASA Class: 2  Procedure Details  The patient was seen in the Holding Room. The risks, benefits, complications, treatment options, and expected outcomes were discussed with the patient.  The patient concurred with the proposed plan, giving informed consent. The risks of anesthesia, infection, bleeding and possible injury to other organs discussed. Injury to bowel, bladder, or ureter with possible need for repair discussed. Possible need for transfusion with secondary risks of hepatitis or HIV acquisition discussed. Post operative complications to include but not limited to DVT, PE and Pneumonia noted. The site of surgery properly noted/marked. The patient was taken to Operating Room # C, identified as Jillyn Axley and the procedure verified as C-Section Delivery. A Time Out was held and the above information confirmed.  After induction of anesthesia, the patient was draped and prepped in the usual sterile manner. A Pfannenstiel incision was made and carried down through the subcutaneous tissue to the fascia. Fascial incision was made and extended transversely using Mayo scissors. The fascia was separated from the underlying rectus tissue superiorly and inferiorly. The peritoneum was identified and entered. Peritoneal incision was extended longitudinally. The utero-vesical peritoneal reflection was incised transversely and the bladder flap was bluntly freed from the lower uterine segment and mid uterine body with sharp dissection. A low transverse uterine incision(Kerr hysterotomy) was made. Delivered from OA presentation was a  female with Apgar scores of 8 at one  minute and 9 at five minutes. Bulb suctioning gently performed. Neonatal team in attendance.After the umbilical cord was clamped and cut cord blood was obtained for evaluation. The placenta was removed intact and appeared normal. The uterus was curetted with a dry lap pack. Good hemostasis was noted.The uterine outline, tubes and ovaries appeared normal. The uterine incision was closed with running locked sutures of 0 Monocryl x 2 layers. Hemostasis was observed. Lavage was carried out until clear.The parietal peritoneum was closed with a running 2-0 Monocryl suture after lysis of omental adhesions to ant abdominal wall . The fascia was then reapproximated with running sutures of 0 Monocryl. The skin was reapproximated with 3-0 monocryl after [place] closure with 2=0 plain.  Instrument, sponge, and needle counts were correct prior the abdominal closure and at the conclusion of the case.   Findings: FTLF, OA, post placenta. Nl adnexa. Omental adhesions to ant abdominal wall, bladder flap adnesion to LUS and mid uterine body  Estimated Blood Loss:  300 mL         Drains: foley                 Specimens: placenta                 Complications:  None; patient tolerated the procedure well.         Disposition: PACU - hemodynamically stable.         Condition: stable  Attending Attestation: I performed the procedure.

## 2020-03-05 LAB — CBC
HCT: 27.6 % — ABNORMAL LOW (ref 36.0–46.0)
Hemoglobin: 8.5 g/dL — ABNORMAL LOW (ref 12.0–15.0)
MCH: 25.9 pg — ABNORMAL LOW (ref 26.0–34.0)
MCHC: 30.8 g/dL (ref 30.0–36.0)
MCV: 84.1 fL (ref 80.0–100.0)
Platelets: 151 10*3/uL (ref 150–400)
RBC: 3.28 MIL/uL — ABNORMAL LOW (ref 3.87–5.11)
RDW: 14.6 % (ref 11.5–15.5)
WBC: 11 10*3/uL — ABNORMAL HIGH (ref 4.0–10.5)
nRBC: 0 % (ref 0.0–0.2)

## 2020-03-05 LAB — CBC AND DIFFERENTIAL
HCT: 28 — AB (ref 36–46)
Hemoglobin: 8.5 — AB (ref 12.0–16.0)
Platelets: 151 (ref 150–399)
WBC: 11

## 2020-03-05 LAB — BIRTH TISSUE RECOVERY COLLECTION (PLACENTA DONATION)

## 2020-03-05 MED ORDER — SODIUM CHLORIDE 0.9 % IV SOLN
510.0000 mg | Freq: Once | INTRAVENOUS | Status: AC
  Administered 2020-03-05: 510 mg via INTRAVENOUS
  Filled 2020-03-05: qty 17

## 2020-03-05 NOTE — Lactation Note (Signed)
This note was copied from a baby's chart. Lactation Consultation Note  Patient Name: April Bernard [id number] Date: 03/05/2020 Reason for consult: Follow-up assessment;Term  LC checked on P2 Mom of term baby at 47 hrs old.  Baby at 4.7% weight loss and having good output and breastfeeding well.  Baby at the breast STS, with a deep latch and swallows identified.  Mom denies any problems.   Feeding Feeding Type: Breast Fed  LATCH Score Latch: Grasps breast easily, tongue down, lips flanged, rhythmical sucking.  Audible Swallowing: Spontaneous and intermittent  Type of Nipple: Everted at rest and after stimulation  Comfort (Breast/Nipple): Soft / non-tender  Hold (Positioning): No assistance needed to correctly position infant at breast.  LATCH Score: 10  Interventions Interventions: Breast feeding basics reviewed;Skin to skin;Breast massage;Hand express  Consult Status Consult Status: Follow-up Date: 03/06/20 Follow-up type: In-patient    [name] 03/05/2020, 3:00 PM

## 2020-03-05 NOTE — Progress Notes (Signed)
POSTOPERATIVE DAY # 1 S/P Repeat cesarean section, baby girl "[place] Citizen"  S:         Reports feeling well; minimal discomfort, gas pain improving  Denies HA, visual changes, RUQ/epigastric pain  Reports some mild dizziness last night and this morning             Tolerating po intake / no nausea / no vomiting / + flatus / no BM  Denies SOB, or CP             Bleeding is light             Pain controlled with Motrin and Percocet             Up ad lib / ambulatory/ voiding QS x 2 without difficulty   Newborn breast feeding - latching well; reports hx of low supply   O:  VS: BP 113/71 (BP Location: Left Arm)   Pulse 64   Temp 98.6 F (37 C) (Oral)   Resp 17   Ht 5\' 4"  (1.626 m)   Wt 99.9 kg   SpO2 98%   Breastfeeding Unknown   BMI 37.81 kg/m  Patient Vitals for the past 24 hrs:  BP Temp Temp src Pulse Resp SpO2  03/05/20 0232 113/71 98.6 F (37 C) Oral 64 17 --  03/04/20 2222 117/70 98.5 F (36.9 C) Oral 72 18 --  03/04/20 1744 120/73 -- -- 86 -- --  03/04/20 1734 (!) 143/76 -- -- 87 -- --  03/04/20 1733 128/80 -- -- 83 -- --  03/04/20 1732 129/72 98.5 F (36.9 C) Oral 87 18 98 %  03/04/20 1430 136/77 98.6 F (37 C) Oral 65 16 99 %  03/04/20 1330 135/79 98.4 F (36.9 C) Oral 65 17 98 %  03/04/20 1230 134/88 98.5 F (36.9 C) Oral 78 20 --  03/04/20 1045 139/84 98.4 F (36.9 C) Oral 72 18 100 %  03/04/20 1030 124/89 98.4 F (36.9 C) Oral 71 20 96 %  03/04/20 1015 129/81 -- -- 71 (!) 22 97 %  03/04/20 1000 (!) 131/91 -- -- 73 14 98 %  03/04/20 0945 125/81 -- -- 76 18 97 %  03/04/20 0938 130/84 (!) 97.5 F (36.4 C) Oral 84 18 100 %    LABS:               Recent Labs    03/02/20 0831 03/05/20 0528  WBC 8.3 11.0*  HGB 11.0* 8.5*  PLT 152 151               Bloodtype: --/--/A POS, A POS Performed at [facility], [address], [place], [place]  [phone]/03 0830)  Rubella: Immune (10/06 0000)                                             I&O:  Intake/Output      05/05 0701 - 05/06 0700 05/06 0701 - 05/07 0700   I.V. (mL/kg) 4070.4 (40.7)    Total Intake(mL/kg) 4070.4 (40.7)    Urine (mL/kg/hr) 4525 (1.9)    Blood 334    Total Output 4859    Net -788.6                      Physical Exam:  Alert and Oriented X3  Lungs: Clear and unlabored  Heart: regular rate and rhythm / no murmurs  Abdomen: soft, non-tender, moderate gaseous distention, active bowel sounds in all quadrants             Fundus: firm, non-tender, U-2             Dressing: pressure dressing on; honeycomb with old drainage, otherwise dry and intact              Incision:  approximated with sutures / no erythema / no ecchymosis / no active drainage  Perineum: intact  Lochia: small rubra on pad   Extremities: trace LE edema, no calf pain or tenderness,   A/P:     POD # 1 S/P Repeat LTCS            Hx. Of PEC with last pregnancy and PP PEC   -  1 mild range BPs towards end of pregnancy and 2 mild range since admission   - monitor for PP PEC  ABL Anemia   - Feraheme IVPB x 1 dose   - Then plan for oral FE x 6 weeks PP  Routine postoperative care              Warm liquids and ambulation to promote bowel motility   Okay to d/c IV after Feraheme  May shower today and remove pressure dressing   Lactation support  Continue current care; interested in early d/c home tomorrow     Lars Pinks, MSN, CNM [facility]

## 2020-03-05 NOTE — Progress Notes (Signed)
CSW received consult for hx of Depression.  CSW met with MOB to offer support and complete assessment.    CSW congratulated MOB and FOB on the birth of infant. CSW advised  MOB of CSW's role and the reason for CSW coming to visit with her. MOB reported that in 2015 she was diagnosed with mild depression. MOB reports that her depression did require medication and therapy, however MOB no longer utilizing either. MOB reported that she has no current need for medication and [place], however of needed MOB would be able to locate a therapist that is closer. MOB reported that she has no other mental health diagnosis and reported no SI or HI.   MOB reports that her supports are her parents who live close by. MOB informed CSW that she has all needed items to care for infant with no other needs or concerns at this time.   CSW provided education regarding the baby blues period vs. perinatal mood disorders, discussed treatment and gave resources for mental health follow up if concerns arise.  CSW recommends self-evaluation during the postpartum time period using the New Mom Checklist from Postpartum Progress and encouraged MOB to contact a medical professional if symptoms are noted at any time.   CSW provided review of Sudden Infant Death Syndrome (SIDS) precautions.     CSW identifies no further need for intervention and no barriers to discharge at this time.   Edsel Shives S. Beatriz Quintela, MSW, LCSW [facility] [phone]  

## 2020-03-06 MED ORDER — OXYCODONE-ACETAMINOPHEN 5-325 MG PO TABS: 1.0000 | ORAL_TABLET | ORAL | 0 refills | Status: DC | PRN

## 2020-03-06 MED ORDER — IBUPROFEN 800 MG PO TABS: 800.0000 mg | ORAL_TABLET | Freq: Four times a day (QID) | ORAL | 0 refills | Status: DC

## 2020-03-06 MED ORDER — SENNOSIDES-DOCUSATE SODIUM 8.6-50 MG PO TABS: 2.0000 | ORAL_TABLET | ORAL | 1 refills | Status: DC

## 2020-03-06 NOTE — Lactation Note (Signed)
This note was copied from a baby's chart. Lactation Consultation Note  Patient Name: April Bernard [id number]'E Date: 03/06/2020 Reason for consult: Follow-up assessment;Term Baby is 48 hours old/8% weight loss.  Mom reports that baby cluster fed last night and nipples are sore.  Assisted with latch in cross cradle hold.  Instructed on gentle chin tug to flange lower lip.  Instructed to hold baby closer with cheeks touching breast.  Mom states latch feels more comfortable.  Discussed milk coming to volume and the prevention and treatment of engorgement.  Mom has a breast pump at home. Answered questions.  Reviewed outpatient services and encouraged to call prn. Maternal Data    Feeding Feeding Type: Breast Fed  LATCH Score Latch: Grasps breast easily, tongue down, lips flanged, rhythmical sucking.  Audible Swallowing: A few with stimulation  Type of Nipple: Everted at rest and after stimulation  Comfort (Breast/Nipple): Filling, red/small blisters or bruises, mild/mod discomfort  Hold (Positioning): Assistance needed to correctly position infant at breast and maintain latch.  LATCH Score: 7  Interventions Interventions: Assisted with latch;Adjust position;Skin to skin;Support pillows  Lactation Tools Discussed/Used     Consult Status Consult Status: Complete Follow-up type: Call as needed    Huston Foley 03/06/2020, 9:38 AM

## 2020-03-06 NOTE — Discharge Summary (Signed)
OB Discharge Summary  Patient Name: April Bernard DOB: [birth year] MRN: [id number]  Date of admission: 03/04/2020 Delivering MD: Brien Few   Date of discharge: 03/06/2020  Admitting diagnosis: Previous cesarean section [Z98.891] Previous cesarean delivery affecting pregnancy [O34.219] Intrauterine pregnancy: [redacted]w[redacted]d     Secondary diagnosis:Principal Problem:   Postpartum care following cesarean delivery 5/5 Active Problems:   Previous cesarean section   Previous cesarean delivery affecting pregnancy  Additional problems:anemia     Discharge diagnosis: Term Pregnancy Delivered                                                                     Post partum procedures:none  Augmentation: RCS  Complications: None  Hospital course:  Sceduled C/S   34 y.o. yo G2P2002 at [redacted]w[redacted]d was admitted to the hospital 03/04/2020 for scheduled cesarean section with the following indication:Elective Repeat.  Membrane Rupture Time/Date: 8:54 AM ,03/04/2020   Patient delivered a Viable infant.03/04/2020  Details of operation can be found in separate operative note.  Pateint had an uncomplicated postpartum course.  She is ambulating, tolerating a regular diet, passing flatus, and urinating well. Patient is discharged home in stable condition on  03/06/20         Physical exam  Vitals:   03/05/20 0232 03/05/20 1438 03/05/20 2140 03/06/20 0545  BP: 113/71 135/83 126/69 131/79  Pulse: 64 72 81 81  Resp: 17 18 16    Temp: 98.6 F (37 C) 98.3 F (36.8 C) (!) 97.3 F (36.3 C) 98.1 F (36.7 C)  TempSrc: Oral Oral Oral Oral  SpO2:   100%   Weight:      Height:       General: alert, cooperative and no distress Lochia: appropriate Uterine Fundus: firm Incision: Healing well with no significant drainage DVT Evaluation: No evidence of DVT seen on physical exam. Labs: Lab Results  Component Value Date   WBC 11.0 (H) 03/05/2020   HGB 8.5 (L) 03/05/2020   HCT 27.6 (L) 03/05/2020   MCV 84.1  03/05/2020   PLT 151 03/05/2020   CMP Latest Ref Rng & Units 09/18/2018  Glucose 65 - 99 mg/dL 86  BUN 7 - 25 mg/dL 14  Creatinine 0.50 - 1.10 mg/dL 0.72  Sodium 135 - 146 mmol/L 139  Potassium 3.5 - 5.3 mmol/L 4.1  Chloride 98 - 110 mmol/L 105  CO2 20 - 32 mmol/L 24  Calcium 8.6 - 10.2 mg/dL 10.0  Total Protein 6.1 - 8.1 g/dL 7.5  Total Bilirubin 0.2 - 1.2 mg/dL 0.5  Alkaline Phos 25 - 125 -  AST 10 - 30 U/L 18  ALT 6 - 29 U/L 22    Discharge instruction: per After Visit Summary and "Baby and Me Booklet".  After Visit Meds:  Allergies as of 03/06/2020   No Known Allergies     Medication List    STOP taking these medications   amphetamine-dextroamphetamine 20 MG 24 hr capsule Commonly known as: ADDERALL XR   aspirin EC 81 MG tablet     TAKE these medications   famotidine 10 MG tablet Commonly known as: PEPCID Take 10 mg by mouth 2 (two) times daily.   fluticasone 50 MCG/ACT nasal spray Commonly known as: [place]  1 spray into both nostrils daily.   ibuprofen 800 MG tablet Commonly known as: ADVIL Take 1 tablet (800 mg total) by mouth every 6 (six) hours.   loratadine 10 MG tablet Commonly known as: CLARITIN Take 10 mg by mouth daily.   oxyCODONE-acetaminophen 5-325 MG tablet Commonly known as: PERCOCET/ROXICET Take 1-2 tablets by mouth every 4 (four) hours as needed for moderate pain.   prenatal multivitamin Tabs tablet Take 1 tablet by mouth daily at 12 noon.   senna-docusate 8.6-50 MG tablet Commonly known as: Senokot-S Take 2 tablets by mouth daily. Start taking on: Mar 07, 2020       Diet: routine diet  Activity: Advance as tolerated. Pelvic rest for 6 weeks.   Outpatient follow up:1wk bp check given h/o PP PEC Follow up Appt:No future appointments. Follow up visit: No follow-ups on file.  Postpartum contraception: Not Discussed  Newborn Data: Live born female  Birth Weight: 7 lb 4.4 oz (3300 g) APGAR: 8, 9  Newborn Delivery    Birth date/time: 03/04/2020 08:54:00 Delivery type: C-Section, Low Transverse Trial of labor: No C-section categorization: Repeat      Baby Feeding: Breast Disposition:home with mother   03/06/2020 Lendon Colonel, MD

## 2020-04-17 LAB — CBC AND DIFFERENTIAL
HCT: 37 (ref 36–46)
Hemoglobin: 12.1 (ref 12.0–16.0)
Platelets: 281 (ref 150–399)
WBC: 6.9

## 2020-04-17 LAB — HM PAP SMEAR: HM Pap smear: NORMAL

## 2020-04-17 LAB — CBC: RBC: 4.58 (ref 3.87–5.11)

## 2020-05-07 ENCOUNTER — Encounter: Payer: Self-pay | Admitting: Family Medicine

## 2020-05-07 ENCOUNTER — Telehealth: Payer: Self-pay

## 2020-05-07 ENCOUNTER — Telehealth (INDEPENDENT_AMBULATORY_CARE_PROVIDER_SITE_OTHER): Payer: Commercial Managed Care - PPO | Admitting: Family Medicine

## 2020-05-07 DIAGNOSIS — R112 Nausea with vomiting, unspecified: Secondary | ICD-10-CM

## 2020-05-07 DIAGNOSIS — M549 Dorsalgia, unspecified: Secondary | ICD-10-CM

## 2020-05-07 DIAGNOSIS — R1013 Epigastric pain: Secondary | ICD-10-CM

## 2020-05-07 NOTE — Telephone Encounter (Signed)
Please assist with scheduling, thanks.  Client [facility] Day - Client Client Site [facility] - Day Physician Felix Pacini Contact Type Call Who Is Calling Patient / Member / Family / Caregiver Caller Name April Bernard Caller Phone Number [phone] Patient Name April Bernard Patient DOB May 18, 1986 Call Type Message Only Information Provided Reason for Call Request to Reschedule Office Appointment Initial Comment Caller states she made her appointment today but she believes she has gall bladder stones and she would like something sooner. Disp. Time Disposition Final User 05/06/2020 7:40:13 PM General Information Provided Yes Valli Glance Call Closed By: Valli Glance Transaction Date/Time: 05/06/2020 7:37:56 PM (ET)

## 2020-05-07 NOTE — Progress Notes (Signed)
Virtual Visit via Video Note  I connected with April Bernard  on 05/07/20 at  3:00 PM EDT by a video enabled telemedicine application and verified that I am speaking with the correct person using two identifiers.  Location patient: home, [place] Location provider:work or home office Persons participating in the virtual visit: patient, provider  I discussed the limitations of evaluation and management by telemedicine and the availability of in person appointments. The patient expressed understanding and agreed to proceed.   HPI:  Acute visit for back pain: -started in mid May 2021, intermittent episodes of moderate pain in the mid back -she talk with her OB about this as it started just after childbirth, he thought it was muscular -episodes have become more frequent 1-several times per week -she had nausea, vomitingx3, epigastric pain with mid back pain 2 days ago (non bilious, non bloody) -no diarrhea, fevers, change in bowels, bleeding, malaise with the episode and symptoms resolved fairly promptly as the pain subsided -denies any sick contacts, has been isolated, fully vaccinated for covid19  -BP 117/80, had mild pre-eclampsia   ROS: See pertinent positives and negatives per HPI.  Past Medical History:  Diagnosis Date  . ADD (attention deficit disorder)   . Allergic rhinitis   . Chicken pox   . Depression   . Ganglion cyst of right foot   . History of pregnancy induced hypertension   . Preeclampsia    2018  . Vaginal Pap smear, abnormal     Past Surgical History:  Procedure Laterality Date  . CESAREAN SECTION  11/08/2016  . CESAREAN SECTION N/A 03/04/2020   Procedure: Repeat CESAREAN SECTION;  Surgeon: Olivia Mackie, MD;  Location: [facility] LD ORS;  Service: Obstetrics;  Laterality: N/A;  EDD: 03/10/20  . REFRACTIVE SURGERY Bilateral 2011    Family History  Problem Relation Age of Onset  . Arthritis Mother   . Hypertension Mother   . Hyperlipidemia Father   . Arthritis Maternal  Grandmother   . Hypertension Maternal Grandmother   . Hypertension Maternal Grandfather   . Heart disease Maternal Grandfather   . Diabetes Maternal Grandfather   . Hypertension Paternal Grandmother   . Hyperlipidemia Paternal Grandmother   . Breast cancer Paternal Grandmother   . Hypertension Paternal Grandfather   . Hyperlipidemia Paternal Grandfather   . Prostate cancer Paternal Grandfather     SOCIAL HX: see hpi   Current Outpatient Medications:  .  fluticasone (FLONASE) 50 MCG/ACT nasal spray, Place 1 spray into both nostrils daily. , Disp: , Rfl:  .  loratadine (CLARITIN) 10 MG tablet, Take 10 mg by mouth daily., Disp: , Rfl:   EXAM:  VITALS per patient if applicable:denies fevers  GENERAL: alert, oriented, appears well and in no acute distress  HEENT: atraumatic, conjunttiva clear, no obvious abnormalities on inspection of external nose and ears  NECK: normal movements of the head and neck  LUNGS: on inspection no signs of respiratory distress, breathing rate appears normal, no obvious gross SOB, gasping or wheezing  CV: no obvious cyanosis  MS: moves all visible extremities without noticeable abnormality  PSYCH/NEURO: pleasant and cooperative, no obvious depression or anxiety, speech and thought processing grossly intact  ASSESSMENT AND PLAN:  Discussed the following assessment and plan:  Mid back pain  Epigastric pain  Nausea and vomiting, intractability of vomiting not specified, unspecified vomiting type  -we discussed possible serious and likely etiologies, options for evaluation and workup, limitations of telemedicine visit vs in person visit, treatment, treatment risks  and precautions. Discussed numerous potential etiologies for epigastric pain, back pain, and the episode of emesis a few days ago. Currently symptoms are resolve. Opted to start nexium 20mg  daily and advised referral for inperson evaluation in the next 5-7 days to further evaluation. May  need labs and imaging if any further symptoms. Sent message to assistant to help with contacting PCP office for appointment. Advised pt to call PCP office in the morning if she is not contacted regarding a follow up appointment in the next 1 week. Advised if any further episodes of pain with vomiting, worsening, severe pain or concerns in the interim to seek immediate medication care at appropriate location [facility] or ER if needed.  I discussed the assessment and treatment plan with the patient. The patient was provided an opportunity to ask questions and all were answered. The patient agreed with the plan and demonstrated an understanding of the instructions.   The patient was advised to call back or seek an in-person evaluation if the symptoms worsen or if the condition fails to improve as anticipated.   AVITA ONTARIO, DO

## 2020-05-07 NOTE — Telephone Encounter (Signed)
Patient has scheduled appt with another provider this afternoon. Nothing further needed.

## 2020-05-15 ENCOUNTER — Ambulatory Visit: Payer: Commercial Managed Care - PPO | Admitting: Family Medicine

## 2020-05-15 ENCOUNTER — Other Ambulatory Visit: Payer: Self-pay

## 2020-05-15 ENCOUNTER — Telehealth: Payer: Self-pay | Admitting: Family Medicine

## 2020-05-15 VITALS — BP 124/84 | HR 73 | Temp 98.3°F | Resp 17 | Ht 65.0 in | Wt 196.0 lb

## 2020-05-15 DIAGNOSIS — M546 Pain in thoracic spine: Secondary | ICD-10-CM | POA: Diagnosis not present

## 2020-05-15 MED ORDER — NAPROXEN 250 MG PO TABS: 250.0000 mg | ORAL_TABLET | Freq: Two times a day (BID) | ORAL | 0 refills | Status: DC

## 2020-05-15 NOTE — Patient Instructions (Addendum)
Naproxen every 12 hours with food for 5 days. Avoid breast mild collection 2-4 hours before or after  Naproxen if able.  The left side of your back (both upper and lower) seemed strained.  Heat. Massage.  Physical therapy referral placed.    Sacroiliac Joint Dysfunction  Sacroiliac joint dysfunction is a condition that causes inflammation on one or both sides of the sacroiliac (SI) joint. The SI joint connects the lower part of the spine (sacrum) with the two upper portions of the pelvis (ilium). This condition causes deep aching or burning pain in the low back. In some cases, the pain may also spread into one or both buttocks, hips, or thighs. What are the causes? This condition may be caused by:  Pregnancy. During pregnancy, extra stress is put on the SI joints because the pelvis widens.  Injury, such as: ? Injuries from car accidents. ? Sports-related injuries. ? Work-related injuries.  Having one leg that is shorter than the other.  Conditions that affect the joints, such as: ? Rheumatoid arthritis. ? Gout. ? Psoriatic arthritis. ? Joint infection (septic arthritis). Sometimes, the cause of SI joint dysfunction is not known. What are the signs or symptoms? Symptoms of this condition include:  Aching or burning pain in the lower back. The pain may also spread to other areas, such as: ? Buttocks. ? Groin. ? Thighs.  Muscle spasms in or around the painful areas.  Increased pain when standing, walking, running, stair climbing, bending, or lifting. How is this diagnosed? This condition is diagnosed with a physical exam and medical history. During the exam, the health care provider may move one or both of your legs to different positions to check for pain. Various tests may be done to confirm the diagnosis, including:  Imaging tests to look for other causes of pain. These may include: ? MRI. ? CT scan. ? Bone scan.  Diagnostic injection. A numbing medicine is injected into  the SI joint using a needle. If your pain is temporarily improved or stopped after the injection, this can indicate that SI joint dysfunction is the problem. How is this treated? Treatment depends on the cause and severity of your condition. Treatment options may include:  Ice or heat applied to the lower back area after an injury. This may help reduce pain and muscle spasms.  Medicines to relieve pain or inflammation or to relax the muscles.  Wearing a back brace (sacroiliac brace) to help support the joint while your back is healing.  Physical therapy to increase muscle strength around the joint and flexibility at the joint. This may also involve learning proper body positions and ways of moving to relieve stress on the joint.  Direct manipulation of the SI joint.  Injections of steroid medicine into the joint to reduce pain and swelling.  Radiofrequency ablation to burn away nerves that are carrying pain messages from the joint.  Use of a device that provides electrical stimulation to help reduce pain at the joint.  Surgery to put in screws and plates that limit or prevent joint motion. This is rare. Follow these instructions at home: Medicines  Take over-the-counter and prescription medicines only as told by your health care provider.  Do not drive or use heavy machinery while taking prescription pain medicine.  If you are taking prescription pain medicine, take actions to prevent or treat constipation. Your health care provider may recommend that you: ? Drink enough fluid to keep your urine pale yellow. ? Eat foods that  are high in fiber, such as fresh fruits and vegetables, whole grains, and beans. ? Limit foods that are high in fat and processed sugars, such as fried or sweet foods. ? Take an over-the-counter or prescription medicine for constipation. If you have a brace:  Wear the brace as told by your health care provider. Remove it only as told by your health care  provider.  Keep the brace clean.  If the brace is not waterproof: ? Do not let it get wet. ? Cover it with a watertight covering when you take a bath or a shower. Managing pain, stiffness, and swelling      Icing can help with pain and swelling. Heat may help with muscle tension or spasms. Ask your health care provider if you should use ice or heat.  If directed, put ice on the affected area: ? If you have a removable brace, remove it as told by your health care provider. ? Put ice in a plastic bag. ? Place a towel between your skin and the bag. ? Leave the ice on for 20 minutes, 2-3 times a day.  If directed, apply heat to the affected area. Use the heat source that your health care provider recommends, such as a moist heat pack or a heating pad. ? Place a towel between your skin and the heat source. ? Leave the heat on for 20-30 minutes. ? Remove the heat if your skin turns bright red. This is especially important if you are unable to feel pain, heat, or cold. You may have a greater risk of getting burned. General instructions  Rest as needed. Ask your health care provider what activities are safe for you.  Return to your normal activities as told by your health care provider.  Exercise as directed by your health care provider or physical therapist.  Do not use any products that contain nicotine or tobacco, such as cigarettes and e-cigarettes. These can delay bone healing. If you need help quitting, ask your health care provider.  Keep all follow-up visits as told by your health care provider. This is important. Contact a health care provider if:  Your pain is not controlled with medicine.  You have a fever.  Your pain is getting worse. Get help right away if:  You have weakness, numbness, or tingling in your legs or feet.  You lose control of your bladder or bowel. Summary  Sacroiliac joint dysfunction is a condition that causes inflammation on one or both sides of  the sacroiliac (SI) joint.  This condition causes deep aching or burning pain in the low back. In some cases, the pain may also spread into one or both buttocks, hips, or thighs.  Treatment depends on the cause and severity of your condition. It may include medicines to reduce pain and swelling or to relax muscles. This information is not intended to replace advice given to you by your health care provider. Make sure you discuss any questions you have with your health care provider. Document Revised: 06/13/2018 Document Reviewed: 11/27/2017 Elsevier Patient Education  2020 [employer].

## 2020-05-15 NOTE — Progress Notes (Signed)
This visit occurred during the SARS-CoV-2 public health emergency.  Safety protocols were in place, including screening questions prior to the visit, additional usage of staff PPE, and extensive cleaning of exam room while observing appropriate contact time as indicated for disinfecting solutions.    April Bernard , May 04, 1986, 34 y.o., female MRN: [id number] Patient Care Team    Relationship Specialty Notifications Start End  Natalia Leatherwood, DO PCP - General Family Medicine  06/06/18   Olivia Mackie, MD Consulting Physician Obstetrics and Gynecology  05/15/20     Chief Complaint  Patient presents with  . Abdominal Pain    Pt is no longer having abd pain but is having back pain. Location is mid to upper back. No SOB, No chest pain    . Back Pain     Subjective: Pt presents for an OV for follow-up on abdominal pain and back pain.  Patient had an ED visit recently with another provider.  At that time she was having abdominal pain which has resolved.  She is still endorsing having mid back pain.  She denies chest pain, shortness of breath, abdominal pain or chest pain.  She reports she did not start the Nexium that was prescribed.  She does endorse that the pain started shortly after the birth of her child a few months ago.  She had a C-section.  She does carry her children with her left hand, she is left-handed.  Initially the back pain was in her lower back on the left side, now it is more bra line level and above on the left side.  She has not taken routine anti-inflammatories or the muscle relaxers.  Depression screen [facility] 2/9 05/20/2019 11/28/2018 09/18/2018 06/06/2018  Decreased Interest 0 0 0 0  Down, Depressed, Hopeless 0 0 0 0  PHQ - 2 Score 0 0 0 0  Altered sleeping 0 - - 0  Tired, decreased energy 0 - - 0  Change in appetite 0 - - 1  Feeling bad or failure about yourself  0 - - 0  Trouble concentrating 0 - - 0  Moving slowly or fidgety/restless 0 - - 0  Suicidal thoughts 0 -  - 0  PHQ-9 Score 0 - - 1    No Known Allergies Social History   Social History Narrative   Marital status/children/pets: married, 1 child.    Education/employment: B.S. Degree, works in [employer].   Safety:      -Wears a bicycle helmet riding a bike: No     -smoke alarm in the home:Yes     - wears seatbelt: Yes     - Feels safe in their relationships: Yes   Past Medical History:  Diagnosis Date  . ADD (attention deficit disorder)   . Allergic rhinitis   . Chicken pox   . Depression   . Ganglion cyst of right foot   . History of pregnancy induced hypertension   . Preeclampsia    2018  . Vaginal Pap smear, abnormal    Past Surgical History:  Procedure Laterality Date  . CESAREAN SECTION  11/08/2016  . CESAREAN SECTION N/A 03/04/2020   Procedure: Repeat CESAREAN SECTION;  Surgeon: Olivia Mackie, MD;  Location: [facility] LD [facility]S;  Service: Obstetrics;  Laterality: N/A;  EDD: 03/10/20  . REFRACTIVE SURGERY Bilateral 2011   Family History  Problem Relation Age of Onset  . Arthritis Mother   . Hypertension Mother   . Hyperlipidemia Father   . Arthritis  Maternal Grandmother   . Hypertension Maternal Grandmother   . Hypertension Maternal Grandfather   . Heart disease Maternal Grandfather   . Diabetes Maternal Grandfather   . Hypertension Paternal Grandmother   . Hyperlipidemia Paternal Grandmother   . Breast cancer Paternal Grandmother   . Hypertension Paternal Grandfather   . Hyperlipidemia Paternal Grandfather   . Prostate cancer Paternal Grandfather    Allergies as of 05/15/2020   No Known Allergies     Medication List       Accurate as of May 15, 2020 11:59 PM. If you have any questions, ask your nurse or doctor.        fluticasone 50 MCG/ACT nasal spray Commonly known as: FLONASE Place 1 spray into both nostrils daily.   loratadine 10 MG tablet Commonly known as: CLARITIN Take 10 mg by mouth daily.   naproxen 250 MG tablet Commonly known as:  NAPROSYN Take 1 tablet (250 mg total) by mouth 2 (two) times daily with a meal. Started by: Felix Pacini, DO       All past medical history, surgical history, allergies, family history, immunizations andmedications were updated in the EMR today and reviewed under the history and medication portions of their EMR.     ROS: Negative, with the exception of above mentioned in HPI   Objective:  BP 124/84 (BP Location: Left Arm, Patient Position: Sitting, Cuff Size: Normal)   Pulse 73   Temp 98.3 F (36.8 C) (Temporal)   Resp 17   Ht 5\' 5"  (1.651 m)   Wt 196 lb (88.9 kg)   SpO2 98%   BMI 32.62 kg/m  Body mass index is 32.62 kg/m. Gen: Afebrile. No acute distress. Nontoxic in appearance, well developed, well nourished.  HENT: AT. [place].  Eyes:Pupils Equal Round Reactive to light, Extraocular movements intact,  Conjunctiva without redness, discharge or icterus. Abd: Soft. NTND. BS present.  No masses palpated. No rebound or guarding. MSK: No erythema, no soft tissue swelling of thoracic or lumbar spine.  Muscle ropiness and spasm left paraspinal muscles of thoracic and lumbar spine.  Mild tenderness to palpation left SI.  Negative straight leg raises.  Negative FABRE.  Neurovascularly intact distally. Neuro:  Normal gait. PERLA. EOMi. Alert. Oriented x3  Psych: Normal affect, dress and demeanor. Normal speech. Normal thought content and judgment.  No exam data present No results found. No results found for this or any previous visit (from the past 24 hour(s)).  Assessment/Plan: Shannin Naab is a 34 y.o. female present for OV for  Acute midline thoracic back pain/thoracic-lumbar strain -Patient's exam today is consistent with left-sided thoracic and lumbar strains. -Encouraged her to use heat, massage and attempt to start caring her kids on the right side. - Patient is breast-feeding.  We did discuss short-term NSAID use.  She is pumping/bottlefeeding therefore she will avoid pumping  2-4 hours before and after medication use.  Again short-term use. Referral to physical therapy is suggested and she is agreeable to this today. - Ambulatory referral to Physical Therapy   Reviewed expectations re: course of current medical issues.  Discussed self-management of symptoms.  Outlined signs and symptoms indicating need for more acute intervention.  Patient verbalized understanding and all questions were answered.  Patient received an After-Visit Summary.    Orders Placed This Encounter  Procedures  . Ambulatory referral to Physical Therapy   Meds ordered this encounter  Medications  . naproxen (NAPROSYN) 250 MG tablet    Sig: Take 1 tablet (  250 mg total) by mouth 2 (two) times daily with a meal.    Dispense:  30 tablet    Refill:  0    Referral Orders     Ambulatory referral to Physical Therapy   Note is dictated utilizing voice recognition software. Although note has been proof read prior to signing, occasional typographical errors still can be missed. If any questions arise, please do not hesitate to call for verification.   electronically signed by:  Felix Pacini, DO  [facility] - OR

## 2020-05-15 NOTE — Telephone Encounter (Signed)
Requesting that referral that was entered today be sent to the asap as pt has an appt on Monday 7/19.

## 2020-05-20 ENCOUNTER — Ambulatory Visit: Payer: Commercial Managed Care - PPO | Admitting: Family Medicine

## 2020-06-25 ENCOUNTER — Emergency Department (HOSPITAL_COMMUNITY)
Admission: EM | Admit: 2020-06-25 | Discharge: 2020-06-25 | Disposition: A | Discharge status: Home / Self Care | Payer: Commercial Managed Care - PPO | Attending: Emergency Medicine | Admitting: Emergency Medicine

## 2020-06-25 ENCOUNTER — Encounter (HOSPITAL_COMMUNITY): Payer: Self-pay | Admitting: Emergency Medicine

## 2020-06-25 ENCOUNTER — Emergency Department (HOSPITAL_COMMUNITY): Payer: Commercial Managed Care - PPO

## 2020-06-25 ENCOUNTER — Other Ambulatory Visit: Payer: Self-pay

## 2020-06-25 DIAGNOSIS — R1013 Epigastric pain: Secondary | ICD-10-CM | POA: Diagnosis present

## 2020-06-25 DIAGNOSIS — K8020 Calculus of gallbladder without cholecystitis without obstruction: Secondary | ICD-10-CM | POA: Insufficient documentation

## 2020-06-25 LAB — CBC
HCT: 39.7 % (ref 36.0–46.0)
Hemoglobin: 12.5 g/dL (ref 12.0–15.0)
MCH: 27.3 pg (ref 26.0–34.0)
MCHC: 31.5 g/dL (ref 30.0–36.0)
MCV: 86.7 fL (ref 80.0–100.0)
Platelets: 250 10*3/uL (ref 150–400)
RBC: 4.58 MIL/uL (ref 3.87–5.11)
RDW: 13.7 % (ref 11.5–15.5)
WBC: 8.1 10*3/uL (ref 4.0–10.5)
nRBC: 0 % (ref 0.0–0.2)

## 2020-06-25 LAB — HEPATIC FUNCTION PANEL
ALT: 43 U/L (ref 0–44)
AST: 38 U/L (ref 15–41)
Albumin: 4 g/dL (ref 3.5–5.0)
Alkaline Phosphatase: 91 U/L (ref 38–126)
Bilirubin, Direct: <0.1 mg/dL (ref 0.0–0.2)
Total Bilirubin: 0.5 mg/dL (ref 0.3–1.2)
Total Protein: 6.9 g/dL (ref 6.5–8.1)

## 2020-06-25 LAB — BASIC METABOLIC PANEL
Anion gap: 13 (ref 5–15)
BUN: 17 mg/dL (ref 6–20)
CO2: 24 mmol/L (ref 22–32)
Calcium: 9.6 mg/dL (ref 8.9–10.3)
Chloride: 102 mmol/L (ref 98–111)
Creatinine, Ser: 0.71 mg/dL (ref 0.44–1.00)
GFR calc Af Amer: >60 mL/min (ref >60)
GFR calc non Af Amer: >60 mL/min (ref >60)
Glucose, Bld: 123 mg/dL — ABNORMAL HIGH (ref 70–99)
Potassium: 3.4 mmol/L — ABNORMAL LOW (ref 3.5–5.1)
Sodium: 139 mmol/L (ref 135–145)

## 2020-06-25 LAB — I-STAT BETA HCG BLOOD, ED (MC, WL, AP ONLY): I-stat hCG, quantitative: <5 m[IU]/mL (ref <5)

## 2020-06-25 LAB — TROPONIN I (HIGH SENSITIVITY)
Troponin I (High Sensitivity): 4 ng/L (ref <18)
Troponin I (High Sensitivity): 3 ng/L (ref <18)

## 2020-06-25 LAB — LIPASE, BLOOD: Lipase: 29 U/L (ref 11–51)

## 2020-06-25 MED ORDER — HYDROCODONE-ACETAMINOPHEN 5-325 MG PO TABS: 1.0000 | ORAL_TABLET | Freq: Three times a day (TID) | ORAL | 0 refills | Status: AC | PRN

## 2020-06-25 MED ORDER — ONDANSETRON 8 MG PO TBDP: 8.0000 mg | ORAL_TABLET | Freq: Three times a day (TID) | ORAL | 0 refills | Status: DC | PRN

## 2020-06-25 NOTE — Discharge Instructions (Signed)
You are seen in the ER for pain.  Ultrasound results confirm that you have gallstones. We have contacted the general surgery team.  They have informed us that they will call you tomorrow for a prompt follow-up appointment.  If you do not hear back by 1 PM tomorrow then call the number provided and request a close follow-up.  Return to the ER if your symptoms get worse or if you start developing fevers, chills, severe abdominal pain with intractable vomiting.  Please read the instructions provided to try and minimize chances of gallbladder attack.

## 2020-06-25 NOTE — ED Provider Notes (Signed)
[facility] EMERGENCY DEPARTMENT Provider Note   CSN: [id number] Arrival date & time: 06/25/20  0127     History Chief Complaint  Patient presents with  . Chest Pain    April Bernard is a 34 y.o. female.  HPI     34 year old comes in a chief complaint of epigastric abdominal pain. Patient is few months postpartum now.  She reports having intermittent chest pain radiating to her back with nausea and vomiting.  Past Medical History:  Diagnosis Date  . ADD (attention deficit disorder)   . Allergic rhinitis   . Chicken pox   . Depression   . Ganglion cyst of right foot   . History of pregnancy induced hypertension   . Preeclampsia    2018  . Vaginal Pap smear, abnormal     Patient Active Problem List   Diagnosis Date Noted  . Previous cesarean section 03/04/2020  . Postpartum care following cesarean delivery 5/5 03/04/2020  . Previous cesarean delivery affecting pregnancy 03/04/2020  . Obesity (BMI 30-39.9) 09/18/2018  . Controlled substance agreement signed 06/06/2018  . Attention deficit disorder (ADD) without hyperactivity 06/06/2018    Past Surgical History:  Procedure Laterality Date  . CESAREAN SECTION  11/08/2016  . CESAREAN SECTION N/A 03/04/2020   Procedure: Repeat CESAREAN SECTION;  Surgeon: Olivia Mackie, MD;  Location: [facility] LD ORS;  Service: Obstetrics;  Laterality: N/A;  EDD: 03/10/20  . REFRACTIVE SURGERY Bilateral 2011     OB History    Gravida  2   Para  2   Term  2   Preterm      AB      Living  2     SAB      TAB      Ectopic      Multiple  0   Live Births  2           Family History  Problem Relation Age of Onset  . Arthritis Mother   . Hypertension Mother   . Hyperlipidemia Father   . Arthritis Maternal Grandmother   . Hypertension Maternal Grandmother   . Hypertension Maternal Grandfather   . Heart disease Maternal Grandfather   . Diabetes Maternal Grandfather   . Hypertension Paternal  Grandmother   . Hyperlipidemia Paternal Grandmother   . Breast cancer Paternal Grandmother   . Hypertension Paternal Grandfather   . Hyperlipidemia Paternal Grandfather   . Prostate cancer Paternal Grandfather     Social History   Tobacco Use  . Smoking status: Never Smoker  . Smokeless tobacco: Never Used  Vaping Use  . Vaping Use: Never used  Substance Use Topics  . Alcohol use: Yes  . Drug use: Never    Home Medications Prior to Admission medications   Medication Sig Start Date End Date Taking? Authorizing Provider  fluticasone (FLONASE) 50 MCG/ACT nasal spray Place 1 spray into both nostrils daily.     [provider]  loratadine (CLARITIN) 10 MG tablet Take 10 mg by mouth daily.    [provider]  naproxen (NAPROSYN) 250 MG tablet Take 1 tablet (250 mg total) by mouth 2 (two) times daily with a meal. 05/15/20   Kuneff, Renee A, DO    Allergies    Patient has no known allergies.  Review of Systems   Review of Systems  Physical Exam Updated Vital Signs BP 129/86 (BP Location: Right Arm)   Pulse 89   Temp 97.9 F (36.6 C) (Oral)  Resp 16   SpO2 100%   Physical Exam  ED Results / Procedures / Treatments   Labs (all labs ordered are listed, but only abnormal results are displayed) Labs Reviewed  BASIC METABOLIC PANEL - Abnormal; Notable for the following components:      Result Value   Potassium 3.4 (*)    Glucose, Bld 123 (*)    All other components within normal limits  CBC  HEPATIC FUNCTION PANEL  LIPASE, BLOOD  I-STAT BETA HCG BLOOD, ED ([facility], [facility], [facility] ONLY)  TROPONIN I (HIGH SENSITIVITY)  TROPONIN I (HIGH SENSITIVITY)    EKG None  Radiology DG Chest 2 View  Result Date: 06/25/2020 CLINICAL DATA:  Chest pain EXAM: CHEST - 2 VIEW COMPARISON:  None. FINDINGS: The heart size and mediastinal contours are within normal limits. Both lungs are clear. The visualized skeletal structures are unremarkable. IMPRESSION: No active  cardiopulmonary disease. Electronically Signed   By: Helyn Numbers MD   On: 06/25/2020 02:25   US Abdomen Limited  Result Date: 06/25/2020 CLINICAL DATA:  Right upper quadrant pain EXAM: ULTRASOUND ABDOMEN LIMITED RIGHT UPPER QUADRANT COMPARISON:  None. FINDINGS: Gallbladder: There are numerous hyperdense shadowing stones within the gallbladder lumen, largest discrete stone measures up to 9 mm in diameter. Gallbladder is partially contracted with gallbladder wall thickening of 7 mm. No pericholecystic fluid or edema. No sonographic Murphy sign noted by sonographer. Common bile duct: Diameter: 3 mm. Liver: No focal lesion identified. Mildly increased hepatic parenchymal echogenicity. Portal vein is patent on color Doppler imaging with normal direction of blood flow towards the liver. Other: None. IMPRESSION: 1. Cholelithiasis. 2. Gallbladder wall is diffusely thickened measuring 7 mm. These findings may be exaggerated by a partially contracted gallbladder. Patient was not NPO for the exam. There is no pericholecystic edema and no sonographic Murphy sign. If there is high clinical suspicion for acute cholecystitis, a nuclear medicine hepatobiliary scan could be performed to assess the patency of the cystic duct and common bile duct. 3. The echogenicity of the liver is increased. This is a nonspecific finding but is most commonly seen with fatty infiltration of the liver. There are no obvious focal liver lesions. Electronically Signed   By: Duanne Guess D.O.   On: 06/25/2020 16:16    Procedures Procedures (including critical care time)  Medications Ordered in ED Medications - No data to display  ED Course  I have reviewed the triage vital signs and the nursing notes.  Pertinent labs & imaging results that were available during my care of the patient were reviewed by me and considered in my medical decision making (see chart for details).    MDM Rules/Calculators/A&P                           DDx includes: Pancreatitis Hepatobiliary pathology including cholecystitis Gastritis/PUD SBO ACS  Patient comes in a chief complaint of intermittent episodes of abdominal pain.  Pain has been radiating to the back.  Mostly the epigastric chest pain.  She was recently pregnant.  Based on our assessment it appears that she likely has cholelithiasis.  Ultrasound confirms it.  Gallbladder wall thickening is pretty impressive, but clinically she is not having cholecystitis.  I spoke with Dr. Donell Beers to ensure that patient gets prompt follow-up.  Strict ER return precautions discussed.   Final Clinical Impression(s) / ED Diagnoses Final diagnoses:  Calculus of gallbladder without cholecystitis without obstruction    Rx / DC  Orders ED Discharge Orders    None       Derwood Kaplan, MD 06/25/20 1845

## 2020-06-25 NOTE — ED Provider Notes (Signed)
Assisting in the ED with triage.  This patient has epigastric and substernal pain penetrating to the back.  She is several months postopartum, and she also has right upper quadrant pain. n I believe this patient had gallbladder disease and possibly gallstone pancreatitis.  Will get ultrasound and check LFTs and lipase level.  Frederik Schmidt, MD   Jimmye Norman, MD 06/25/20 [phone]

## 2020-06-25 NOTE — ED Notes (Signed)
Patient verbalizes understanding of discharge instructions. Opportunity for questioning and answers were provided. Armband removed by staff. Patient discharged from ED.  

## 2020-06-25 NOTE — ED Notes (Signed)
Dr wyatt reviewed pt and ordered additional lab and [place], call made to lab to add on

## 2020-06-25 NOTE — ED Triage Notes (Signed)
Pt c/o intermittent chest pain that radiates to her back as well as nausea.

## 2020-06-26 ENCOUNTER — Encounter: Payer: Self-pay | Admitting: Family Medicine

## 2020-06-26 ENCOUNTER — Other Ambulatory Visit: Payer: Self-pay | Admitting: Surgery

## 2020-06-26 DIAGNOSIS — K8020 Calculus of gallbladder without cholecystitis without obstruction: Secondary | ICD-10-CM | POA: Insufficient documentation

## 2020-06-26 HISTORY — DX: Calculus of gallbladder without cholecystitis without obstruction: K80.20

## 2020-07-27 ENCOUNTER — Observation Stay (HOSPITAL_COMMUNITY): Payer: Commercial Managed Care - PPO

## 2020-07-27 ENCOUNTER — Observation Stay (HOSPITAL_COMMUNITY)
Admission: AD | Admit: 2020-07-27 | Discharge: 2020-07-28 | Disposition: A | Discharge status: Home / Self Care | Payer: Commercial Managed Care - PPO | Source: Ambulatory Visit | Attending: Surgery | Admitting: Surgery

## 2020-07-27 ENCOUNTER — Encounter (HOSPITAL_COMMUNITY): Admission: AD | Disposition: A | Discharge status: Home / Self Care | Payer: Self-pay | Source: Ambulatory Visit

## 2020-07-27 ENCOUNTER — Observation Stay (HOSPITAL_COMMUNITY): Payer: Commercial Managed Care - PPO | Admitting: Certified Registered Nurse Anesthetist

## 2020-07-27 ENCOUNTER — Encounter (HOSPITAL_COMMUNITY): Payer: Self-pay

## 2020-07-27 ENCOUNTER — Other Ambulatory Visit: Payer: Self-pay

## 2020-07-27 DIAGNOSIS — K810 Acute cholecystitis: Secondary | ICD-10-CM

## 2020-07-27 DIAGNOSIS — Z20822 Contact with and (suspected) exposure to covid-19: Secondary | ICD-10-CM | POA: Diagnosis not present

## 2020-07-27 DIAGNOSIS — K819 Cholecystitis, unspecified: Secondary | ICD-10-CM | POA: Diagnosis not present

## 2020-07-27 DIAGNOSIS — Z419 Encounter for procedure for purposes other than remedying health state, unspecified: Secondary | ICD-10-CM

## 2020-07-27 HISTORY — PX: CHOLECYSTECTOMY: SHX55

## 2020-07-27 HISTORY — DX: Acute cholecystitis: K81.0

## 2020-07-27 LAB — COMPREHENSIVE METABOLIC PANEL
ALT: 20 U/L (ref 0–44)
AST: 19 U/L (ref 15–41)
Albumin: 4.8 g/dL (ref 3.5–5.0)
Alkaline Phosphatase: 72 U/L (ref 38–126)
Anion gap: 14 (ref 5–15)
BUN: 12 mg/dL (ref 6–20)
CO2: 22 mmol/L (ref 22–32)
Calcium: 9.5 mg/dL (ref 8.9–10.3)
Chloride: 103 mmol/L (ref 98–111)
Creatinine, Ser: 0.75 mg/dL (ref 0.44–1.00)
GFR calc Af Amer: >60 mL/min (ref >60)
GFR calc non Af Amer: >60 mL/min (ref >60)
Glucose, Bld: 82 mg/dL (ref 70–99)
Potassium: 3.9 mmol/L (ref 3.5–5.1)
Sodium: 139 mmol/L (ref 135–145)
Total Bilirubin: 0.6 mg/dL (ref 0.3–1.2)
Total Protein: 7.8 g/dL (ref 6.5–8.1)

## 2020-07-27 LAB — CBC
HCT: 41.1 % (ref 36.0–46.0)
Hemoglobin: 13.6 g/dL (ref 12.0–15.0)
MCH: 28.9 pg (ref 26.0–34.0)
MCHC: 33.1 g/dL (ref 30.0–36.0)
MCV: 87.3 fL (ref 80.0–100.0)
Platelets: 263 10*3/uL (ref 150–400)
RBC: 4.71 MIL/uL (ref 3.87–5.11)
RDW: 13.2 % (ref 11.5–15.5)
WBC: 8.7 10*3/uL (ref 4.0–10.5)
nRBC: 0 % (ref 0.0–0.2)

## 2020-07-27 LAB — SURGICAL PCR SCREEN
MRSA, PCR: NEGATIVE
Staphylococcus aureus: NEGATIVE

## 2020-07-27 LAB — RESPIRATORY PANEL BY RT PCR (FLU A&B, COVID)
Influenza A by PCR: NEGATIVE
Influenza B by PCR: NEGATIVE
SARS Coronavirus 2 by RT PCR: NEGATIVE

## 2020-07-27 LAB — HIV ANTIBODY (ROUTINE TESTING W REFLEX): HIV Screen 4th Generation wRfx: NONREACTIVE

## 2020-07-27 LAB — PREGNANCY, URINE: Preg Test, Ur: NEGATIVE

## 2020-07-27 SURGERY — LAPAROSCOPIC CHOLECYSTECTOMY WITH INTRAOPERATIVE CHOLANGIOGRAM
Anesthesia: General | Site: Abdomen

## 2020-07-27 MED ORDER — PROPOFOL 10 MG/ML IV BOLUS
INTRAVENOUS | Status: DC | PRN
  Administered 2020-07-27: 200 mg via INTRAVENOUS

## 2020-07-27 MED ORDER — CEFAZOLIN SODIUM-DEXTROSE 2-4 GM/100ML-% IV SOLN
2.0000 g | INTRAVENOUS | Status: DC
  Filled 2020-07-27: qty 100

## 2020-07-27 MED ORDER — ONDANSETRON 4 MG PO TBDP: 4.0000 mg | ORAL_TABLET | Freq: Four times a day (QID) | ORAL | Status: DC | PRN

## 2020-07-27 MED ORDER — LIDOCAINE 2% (20 MG/ML) 5 ML SYRINGE
INTRAMUSCULAR | Status: DC | PRN
  Administered 2020-07-27: 100 mg via INTRAVENOUS

## 2020-07-27 MED ORDER — LIDOCAINE 2% (20 MG/ML) 5 ML SYRINGE
INTRAMUSCULAR | Status: AC
  Filled 2020-07-27: qty 5

## 2020-07-27 MED ORDER — FENTANYL CITRATE (PF) 100 MCG/2ML IJ SOLN
INTRAMUSCULAR | Status: DC | PRN
Start: 2020-07-27 — End: 2020-07-27
  Administered 2020-07-27: 50 ug via INTRAVENOUS
  Administered 2020-07-27: 100 ug via INTRAVENOUS
  Administered 2020-07-27: 50 ug via INTRAVENOUS
  Administered 2020-07-27: 100 ug via INTRAVENOUS
  Administered 2020-07-27: 50 ug via INTRAVENOUS

## 2020-07-27 MED ORDER — LACTATED RINGERS IV SOLN: INTRAVENOUS | Status: DC

## 2020-07-27 MED ORDER — ENOXAPARIN SODIUM 40 MG/0.4ML ~~LOC~~ SOLN: 40.0000 mg | SUBCUTANEOUS | Status: DC

## 2020-07-27 MED ORDER — OXYCODONE HCL 5 MG PO TABS: 5.0000 mg | ORAL_TABLET | Freq: Once | ORAL | Status: DC | PRN

## 2020-07-27 MED ORDER — SODIUM CHLORIDE 0.9 % IV SOLN
20.0000 mL | Freq: Once | INTRAVENOUS | Status: DC
Start: 2020-07-27 — End: 2020-07-27

## 2020-07-27 MED ORDER — RINGERS IRRIGATION IR SOLN
Status: DC | PRN
  Administered 2020-07-27: 1000 mL

## 2020-07-27 MED ORDER — ACETAMINOPHEN 500 MG PO TABS
1000.0000 mg | ORAL_TABLET | Freq: Four times a day (QID) | ORAL | Status: DC
  Administered 2020-07-27 – 2020-07-28 (×2): 1000 mg via ORAL
  Filled 2020-07-27 (×2): qty 2

## 2020-07-27 MED ORDER — POLYETHYLENE GLYCOL 3350 17 G PO PACK: 17.0000 g | PACK | Freq: Every day | ORAL | Status: DC | PRN

## 2020-07-27 MED ORDER — HYDROMORPHONE HCL 1 MG/ML IJ SOLN
INTRAMUSCULAR | Status: AC
  Filled 2020-07-27: qty 1

## 2020-07-27 MED ORDER — CHLORHEXIDINE GLUCONATE 4 % EX LIQD: 60.0000 mL | Freq: Once | CUTANEOUS | Status: DC

## 2020-07-27 MED ORDER — OXYCODONE HCL 5 MG/5ML PO SOLN: 5.0000 mg | Freq: Once | ORAL | Status: DC | PRN

## 2020-07-27 MED ORDER — IBUPROFEN 400 MG PO TABS: 600.0000 mg | ORAL_TABLET | Freq: Four times a day (QID) | ORAL | Status: DC | PRN

## 2020-07-27 MED ORDER — TRAMADOL HCL 50 MG PO TABS: 50.0000 mg | ORAL_TABLET | Freq: Four times a day (QID) | ORAL | Status: DC | PRN

## 2020-07-27 MED ORDER — PROPOFOL 10 MG/ML IV BOLUS
INTRAVENOUS | Status: AC
  Filled 2020-07-27: qty 20

## 2020-07-27 MED ORDER — MORPHINE SULFATE (PF) 2 MG/ML IV SOLN: 2.0000 mg | INTRAVENOUS | Status: DC | PRN

## 2020-07-27 MED ORDER — MUPIROCIN 2 % EX OINT: 1.0000 "application " | TOPICAL_OINTMENT | Freq: Two times a day (BID) | CUTANEOUS | Status: DC

## 2020-07-27 MED ORDER — BUPIVACAINE LIPOSOME 1.3 % IJ SUSP
INTRAMUSCULAR | Status: DC | PRN
  Administered 2020-07-27: 20 mL

## 2020-07-27 MED ORDER — METOPROLOL TARTRATE 5 MG/5ML IV SOLN: 5.0000 mg | Freq: Four times a day (QID) | INTRAVENOUS | Status: DC | PRN

## 2020-07-27 MED ORDER — DEXAMETHASONE SODIUM PHOSPHATE 4 MG/ML IJ SOLN
INTRAMUSCULAR | Status: DC | PRN
  Administered 2020-07-27: 10 mg via INTRAVENOUS

## 2020-07-27 MED ORDER — MIDAZOLAM HCL 5 MG/5ML IJ SOLN
INTRAMUSCULAR | Status: DC | PRN
  Administered 2020-07-27: 2 mg via INTRAVENOUS

## 2020-07-27 MED ORDER — ONDANSETRON HCL 4 MG/2ML IJ SOLN
INTRAMUSCULAR | Status: DC | PRN
  Administered 2020-07-27: 4 mg via INTRAVENOUS

## 2020-07-27 MED ORDER — BUPIVACAINE LIPOSOME 1.3 % IJ SUSP
20.0000 mL | Freq: Once | INTRAMUSCULAR | Status: DC
  Filled 2020-07-27: qty 20

## 2020-07-27 MED ORDER — KETOROLAC TROMETHAMINE 30 MG/ML IJ SOLN
15.0000 mg | Freq: Once | INTRAMUSCULAR | Status: AC
  Administered 2020-07-27: 15 mg via INTRAVENOUS
  Filled 2020-07-27: qty 1

## 2020-07-27 MED ORDER — KCL IN DEXTROSE-NACL 20-5-0.45 MEQ/L-%-% IV SOLN
INTRAVENOUS | Status: DC
  Filled 2020-07-27 (×2): qty 1000

## 2020-07-27 MED ORDER — SODIUM CHLORIDE 0.9 % IV SOLN
2.0000 g | INTRAVENOUS | Status: DC
  Administered 2020-07-27: 2 g via INTRAVENOUS
  Filled 2020-07-27: qty 2

## 2020-07-27 MED ORDER — OXYCODONE HCL 5 MG PO TABS
5.0000 mg | ORAL_TABLET | ORAL | Status: DC | PRN
  Administered 2020-07-27 (×2): 5 mg via ORAL
  Administered 2020-07-28 (×2): 10 mg via ORAL
  Filled 2020-07-27 (×2): qty 2
  Filled 2020-07-27 (×2): qty 1

## 2020-07-27 MED ORDER — FENTANYL CITRATE (PF) 100 MCG/2ML IJ SOLN
INTRAMUSCULAR | Status: AC
  Filled 2020-07-27: qty 2

## 2020-07-27 MED ORDER — KCL IN DEXTROSE-NACL 20-5-0.45 MEQ/L-%-% IV SOLN
INTRAVENOUS | Status: DC
  Filled 2020-07-27: qty 1000

## 2020-07-27 MED ORDER — MORPHINE SULFATE (PF) 2 MG/ML IV SOLN: 1.0000 mg | INTRAVENOUS | Status: DC | PRN

## 2020-07-27 MED ORDER — PROMETHAZINE HCL 25 MG/ML IJ SOLN
6.2500 mg | INTRAMUSCULAR | Status: DC | PRN
  Administered 2020-07-27: 12.5 mg via INTRAVENOUS

## 2020-07-27 MED ORDER — DIPHENHYDRAMINE HCL 50 MG/ML IJ SOLN: 25.0000 mg | Freq: Four times a day (QID) | INTRAMUSCULAR | Status: DC | PRN

## 2020-07-27 MED ORDER — ACETAMINOPHEN 500 MG PO TABS
1000.0000 mg | ORAL_TABLET | ORAL | Status: AC
  Administered 2020-07-27: 1000 mg via ORAL
  Filled 2020-07-27: qty 2

## 2020-07-27 MED ORDER — ROCURONIUM BROMIDE 10 MG/ML (PF) SYRINGE
PREFILLED_SYRINGE | INTRAVENOUS | Status: AC
  Filled 2020-07-27: qty 10

## 2020-07-27 MED ORDER — SODIUM CHLORIDE 0.9 % IV SOLN
INTRAVENOUS | Status: DC | PRN
  Administered 2020-07-27: 10 mL

## 2020-07-27 MED ORDER — DIPHENHYDRAMINE HCL 25 MG PO CAPS: 25.0000 mg | ORAL_CAPSULE | Freq: Four times a day (QID) | ORAL | Status: DC | PRN

## 2020-07-27 MED ORDER — ROCURONIUM BROMIDE 10 MG/ML (PF) SYRINGE
PREFILLED_SYRINGE | INTRAVENOUS | Status: DC | PRN
  Administered 2020-07-27: 60 mg via INTRAVENOUS
  Administered 2020-07-27: 10 mg via INTRAVENOUS

## 2020-07-27 MED ORDER — ONDANSETRON HCL 4 MG/2ML IJ SOLN: 4.0000 mg | Freq: Four times a day (QID) | INTRAMUSCULAR | Status: DC | PRN

## 2020-07-27 MED ORDER — 0.9 % SODIUM CHLORIDE (POUR BTL) OPTIME
TOPICAL | Status: DC | PRN
  Administered 2020-07-27: 1000 mL

## 2020-07-27 MED ORDER — SUGAMMADEX SODIUM 200 MG/2ML IV SOLN
INTRAVENOUS | Status: DC | PRN
  Administered 2020-07-27: 200 mg via INTRAVENOUS

## 2020-07-27 MED ORDER — HYDROMORPHONE HCL 1 MG/ML IJ SOLN
0.2500 mg | INTRAMUSCULAR | Status: DC | PRN
  Administered 2020-07-27 (×2): 0.5 mg via INTRAVENOUS

## 2020-07-27 MED ORDER — FENTANYL CITRATE (PF) 250 MCG/5ML IJ SOLN
INTRAMUSCULAR | Status: AC
  Filled 2020-07-27: qty 5

## 2020-07-27 MED ORDER — PROMETHAZINE HCL 25 MG/ML IJ SOLN
INTRAMUSCULAR | Status: AC
  Filled 2020-07-27: qty 1

## 2020-07-27 MED ORDER — MIDAZOLAM HCL 2 MG/2ML IJ SOLN
INTRAMUSCULAR | Status: AC
  Filled 2020-07-27: qty 2

## 2020-07-27 SURGICAL SUPPLY — 40 items
APPLICATOR COTTON TIP 6 STRL (MISCELLANEOUS) ×2 IMPLANT
APPLICATOR COTTON TIP 6IN STRL (MISCELLANEOUS) ×4
APPLIER CLIP 5 13 M/L LIGAMAX5 (MISCELLANEOUS)
APPLIER CLIP ROT 10 11.4 M/L (STAPLE) ×2
BENZOIN TINCTURE PRP APPL 2/3 (GAUZE/BANDAGES/DRESSINGS) IMPLANT
CABLE HIGH FREQUENCY MONO STRZ (ELECTRODE) IMPLANT
CATH REDDICK CHOLANGI 4FR 50CM (CATHETERS) ×2 IMPLANT
CLIP APPLIE 5 13 M/L LIGAMAX5 (MISCELLANEOUS) IMPLANT
CLIP APPLIE ROT 10 11.4 M/L (STAPLE) ×1 IMPLANT
COVER MAYO STAND STRL (DRAPES) ×2 IMPLANT
COVER SURGICAL LIGHT HANDLE (MISCELLANEOUS) ×2 IMPLANT
COVER WAND RF STERILE (DRAPES) IMPLANT
DECANTER SPIKE VIAL GLASS SM (MISCELLANEOUS) ×2 IMPLANT
DERMABOND ADVANCED (GAUZE/BANDAGES/DRESSINGS) ×1
DERMABOND ADVANCED .7 DNX12 (GAUZE/BANDAGES/DRESSINGS) ×1 IMPLANT
DRAPE C-ARM 42X120 X-RAY (DRAPES) ×2 IMPLANT
ELECT L-HOOK LAP 45CM DISP (ELECTROSURGICAL) ×2
ELECT PENCIL ROCKER SW 15FT (MISCELLANEOUS) ×2 IMPLANT
ELECT REM PT RETURN 15FT ADLT (MISCELLANEOUS) ×2 IMPLANT
ELECTRODE L-HOOK LAP 45CM DISP (ELECTROSURGICAL) ×1 IMPLANT
GLOVE BIOGEL M 8.0 STRL (GLOVE) ×2 IMPLANT
GOWN STRL REUS W/TWL XL LVL3 (GOWN DISPOSABLE) ×2 IMPLANT
HEMOSTAT SURGICEL 4X8 (HEMOSTASIS) IMPLANT
IV CATH 14GX2 1/4 (CATHETERS) ×2 IMPLANT
KIT BASIN OR (CUSTOM PROCEDURE TRAY) ×2 IMPLANT
KIT TURNOVER KIT A (KITS) IMPLANT
POUCH RETRIEVAL ECOSAC 10 (ENDOMECHANICALS) ×1 IMPLANT
POUCH RETRIEVAL ECOSAC 10MM (ENDOMECHANICALS) ×1
SCISSORS LAP 5X45 EPIX DISP (ENDOMECHANICALS) ×2 IMPLANT
SET IRRIG TUBING LAPAROSCOPIC (IRRIGATION / IRRIGATOR) ×2 IMPLANT
SET TUBE SMOKE EVAC HIGH FLOW (TUBING) ×2 IMPLANT
SLEEVE XCEL OPT CAN 5 100 (ENDOMECHANICALS) ×2 IMPLANT
STRIP CLOSURE SKIN 1/2X4 (GAUZE/BANDAGES/DRESSINGS) IMPLANT
SUT MNCRL AB 4-0 PS2 18 (SUTURE) ×4 IMPLANT
SYR 20ML LL LF (SYRINGE) ×2 IMPLANT
TOWEL OR 17X26 10 PK STRL BLUE (TOWEL DISPOSABLE) ×2 IMPLANT
TRAY LAPAROSCOPIC (CUSTOM PROCEDURE TRAY) ×2 IMPLANT
TROCAR BLADELESS OPT 5 100 (ENDOMECHANICALS) ×4 IMPLANT
TROCAR XCEL BLUNT TIP 100MML (ENDOMECHANICALS) IMPLANT
TROCAR XCEL NON-BLD 11X100MML (ENDOMECHANICALS) IMPLANT

## 2020-07-27 NOTE — Interval H&P Note (Signed)
History and Physical Interval Note:  07/27/2020 2:07 PM  April Bernard  has presented today for surgery, with the diagnosis of cholecystitis.  The various methods of treatment have been discussed with the patient and family. After consideration of risks, benefits and other options for treatment, the patient has consented to  Procedure(s): LAPAROSCOPIC CHOLECYSTECTOMY WITH POSSIBLE  INTRAOPERATIVE CHOLANGIOGRAM (N/A) as a surgical intervention.  The patient's history has been reviewed, patient examined, no change in status, stable for surgery.  I have reviewed the patient's chart and labs.  Questions were answered to the patient's satisfaction.     Valarie Merino

## 2020-07-27 NOTE — Discharge Instructions (Signed)
[facility]  Please arrive at least 30 min before your appointment to complete your check in paperwork.  If you are unable to arrive 30 min prior to your appointment time we may have to cancel or reschedule you. LAPAROSCOPIC SURGERY: POST OP INSTRUCTIONS Always review your discharge instruction sheet given to you by the facility where your surgery was performed. IF YOU HAVE DISABILITY OR FAMILY LEAVE FORMS, YOU MUST BRING THEM TO THE OFFICE FOR PROCESSING.   DO NOT GIVE THEM TO YOUR DOCTOR.  PAIN CONTROL  1. First take acetaminophen (Tylenol) AND/or ibuprofen (Advil) to control your pain after surgery.  Follow directions on package.  Taking acetaminophen (Tylenol) and/or ibuprofen (Advil) regularly after surgery will help to control your pain and lower the amount of prescription pain medication you may need.  You should not take more than 4,000 mg (4 grams) of acetaminophen (Tylenol) in 24 hours.  You should not take ibuprofen (Advil), aleve, motrin, naprosyn or other NSAIDS if you have a history of stomach ulcers or chronic kidney disease.  2. A prescription for pain medication may be given to you upon discharge.  Take your pain medication as prescribed, if you still have uncontrolled pain after taking acetaminophen (Tylenol) or ibuprofen (Advil). 3. Use ice packs to help control pain. 4. If you need a refill on your pain medication, please contact your pharmacy.  They will contact our office to request authorization. Prescriptions will not be filled after 5pm or on week-ends.  HOME MEDICATIONS 5. Take your usually prescribed medications unless otherwise directed.  DIET 6. You should follow a light diet the first few days after arrival home.  Be sure to include lots of fluids daily. Avoid fatty, fried foods.   CONSTIPATION 7. It is common to experience some constipation after surgery and if you are taking pain medication.  Increasing fluid intake and taking a stool  softener (such as Colace) will usually help or prevent this problem from occurring.  A mild laxative (Milk of Magnesia or Miralax) should be taken according to package instructions if there are no bowel movements after 48 hours.  WOUND/INCISION CARE 8. Most patients will experience some swelling and bruising in the area of the incisions.  Ice packs will help.  Swelling and bruising can take several days to resolve.  9. Unless discharge instructions indicate otherwise, follow guidelines below  a. STERI-STRIPS - you may remove your outer bandages 48 hours after surgery, and you may shower at that time.  You have steri-strips (small skin tapes) in place directly over the incision.  These strips should be left on the skin for 7-10 days.   b. DERMABOND/SKIN GLUE - you may shower in 24 hours.  The glue will flake off over the next 2-3 weeks. 10. Any sutures or staples will be removed at the office during your follow-up visit.  ACTIVITIES 11. You may resume regular (light) daily activities beginning the next day--such as daily self-care, walking, climbing stairs--gradually increasing activities as tolerated.  You may have sexual intercourse when it is comfortable.  Refrain from any heavy lifting or straining until approved by your doctor. a. You may drive when you are no longer taking prescription pain medication, you can comfortably wear a seatbelt, and you can safely maneuver your car and apply brakes.  FOLLOW-UP 12. You should see your doctor in the office for a follow-up appointment approximately 2-3 weeks after your surgery.  You should have been given your post-op/follow-up appointment when   your surgery was scheduled.  If you did not receive a post-op/follow-up appointment, make sure that you call for this appointment within a day or two after you arrive home to insure a convenient appointment time.   WHEN TO CALL YOUR DOCTOR: 1. Fever over 101.0 2. Inability to urinate 3. Continued bleeding from  incision. 4. Increased pain, redness, or drainage from the incision. 5. Increasing abdominal pain  The clinic staff is available to answer your questions during regular business hours.  Please don't hesitate to call and ask to speak to one of the nurses for clinical concerns.  If you have a medical emergency, go to the nearest emergency room or call 911.  A surgeon from [facility] is always on call at the hospital. [address], [address], [place], [place]  [address] ? [address] [address], [place],    [address] [phone] ? [phone] ? FAX [phone]  .........   Managing Your Pain After Surgery Without Opioids    Thank you for participating in our program to help patients manage their pain after surgery without opioids. This is part of our effort to provide you with the best care possible, without exposing you or your family to the risk that opioids pose.  What pain can I expect after surgery? You can expect to have some pain after surgery. This is normal. The pain is typically worse the day after surgery, and quickly begins to get better. Many studies have found that many patients are able to manage their pain after surgery with Over-the-Counter (OTC) medications such as Tylenol and Motrin. If you have a condition that does not allow you to take Tylenol or Motrin, notify your surgical team.  How will I manage my pain? The best strategy for controlling your pain after surgery is around the clock pain control with Tylenol (acetaminophen) and Motrin (ibuprofen or Advil). Alternating these medications with each other allows you to maximize your pain control. In addition to Tylenol and Motrin, you can use heating pads or ice packs on your incisions to help reduce your pain.  How will I alternate your regular strength over-the-counter pain medication? You will take a dose of pain medication every three hours. ; Start by taking 650 mg of Tylenol (2 pills of 325  mg) ; 3 hours later take 600 mg of Motrin (3 pills of 200 mg) ; 3 hours after taking the Motrin take 650 mg of Tylenol ; 3 hours after that take 600 mg of Motrin.   - 1 -  See example - if your first dose of Tylenol is at 12:00 PM   12:00 PM Tylenol 650 mg (2 pills of 325 mg)  3:00 PM Motrin 600 mg (3 pills of 200 mg)  6:00 PM Tylenol 650 mg (2 pills of 325 mg)  9:00 PM Motrin 600 mg (3 pills of 200 mg)  Continue alternating every 3 hours   We recommend that you follow this schedule around-the-clock for at least 3 days after surgery, or until you feel that it is no longer needed. Use the table on the last page of this handout to keep track of the medications you are taking. Important: Do not take more than 3000mg of Tylenol or 3200mg of Motrin in a 24-hour period. Do not take ibuprofen/Motrin if you have a history of bleeding stomach ulcers, severe kidney disease, &/or actively taking a blood thinner  What if I still have pain? If you have pain that is not   controlled with the over-the-counter pain medications (Tylenol and Motrin or Advil) you might have what we call "breakthrough" pain. You will receive a prescription for a small amount of an opioid pain medication such as Oxycodone, Tramadol, or Tylenol with Codeine. Use these opioid pills in the first 24 hours after surgery if you have breakthrough pain. Do not take more than 1 pill every 4-6 hours.  If you still have uncontrolled pain after using all opioid pills, don't hesitate to call our staff using the number provided. We will help make sure you are managing your pain in the best way possible, and if necessary, we can provide a prescription for additional pain medication.   Day 1    Time  Name of Medication Number of pills taken  Amount of Acetaminophen  Pain Level   Comments  AM PM       AM PM       AM PM       AM PM       AM PM       AM PM       AM PM       AM PM       Total Daily amount of Acetaminophen Do not  take more than  3,000 mg per day      Day 2    Time  Name of Medication Number of pills taken  Amount of Acetaminophen  Pain Level   Comments  AM PM       AM PM       AM PM       AM PM       AM PM       AM PM       AM PM       AM PM       Total Daily amount of Acetaminophen Do not take more than  3,000 mg per day      Day 3    Time  Name of Medication Number of pills taken  Amount of Acetaminophen  Pain Level   Comments  AM PM       AM PM       AM PM       AM PM          AM PM       AM PM       AM PM       AM PM       Total Daily amount of Acetaminophen Do not take more than  3,000 mg per day      Day 4    Time  Name of Medication Number of pills taken  Amount of Acetaminophen  Pain Level   Comments  AM PM       AM PM       AM PM       AM PM       AM PM       AM PM       AM PM       AM PM       Total Daily amount of Acetaminophen Do not take more than  3,000 mg per day      Day 5    Time  Name of Medication Number of pills taken  Amount of Acetaminophen  Pain Level   Comments  AM PM       AM PM       AM   PM       AM PM       AM PM       AM PM       AM PM       AM PM       Total Daily amount of Acetaminophen Do not take more than  3,000 mg per day       Day 6    Time  Name of Medication Number of pills taken  Amount of Acetaminophen  Pain Level  Comments  AM PM       AM PM       AM PM       AM PM       AM PM       AM PM       AM PM       AM PM       Total Daily amount of Acetaminophen Do not take more than  3,000 mg per day      Day 7    Time  Name of Medication Number of pills taken  Amount of Acetaminophen  Pain Level   Comments  AM PM       AM PM       AM PM       AM PM       AM PM       AM PM       AM PM       AM PM       Total Daily amount of Acetaminophen Do not take more than  3,000 mg per day        For additional information about how and where to safely dispose of unused  opioid medications - https://www.morepowerfulnc.org  Disclaimer: This document contains information and/or instructional materials adapted from [facility] for the typical patient with your condition. It does not replace medical advice from your health care provider because your experience may differ from that of the typical patient. Talk to your health care provider if you have any questions about this document, your condition or your treatment plan. Adapted from [facility]   

## 2020-07-27 NOTE — H&P (Signed)
April Bernard 12-Aug-1986 [id number]  Reason for Admission: cholecystitis  This patient was seen by Dr. Ezzard Standing in the office a couple of week ago as detailed below. She had her second child by C-section about 4 months ago. About 2 months ago she started having attacks which started in her back, radiated to her right side, and then became subxiphoid. She has had 3 of these severe attacks. She's had milder attacks every several days. She has a gluten allergy. She has no prior history of stomach, liver, or colon disease. Her only prior abdominal surgery was a C-section. She has no family history of gallbladder disease. She is breast-feeding her daughter.  She went to the [facility] ER on 06/25/2020 for [place] on 06/25/2020 showed cholelithiasis with thickened gall bladder wall.    The patient has started having worsening pain since then and is having difficulty eating with N/V.  Due to these symptoms, she called our office yesterday and we recommended she be direct admitted for lap chole.   Review of Systems as stated in this history (HPI) or in the review of systems. Otherwise all other 12 point ROS are negative  Past Medical History: 1. Gluten allergy  Social History: Married. She has a 52 1/2 yo son and a 71 month old daughter. She was born by C section. She is breast feeding. she is not working.  The patient's family history was non contributory.  Past Surgical History Santiago Glad, [place]; 06/26/2020 3:05 PM) Cesarean Section - Multiple   Diagnostic Studies History Santiago Glad, [place]; 06/26/2020 3:05 PM) Colonoscopy  never  Allergies Santiago Glad, CMA; 06/26/2020 3:06 PM) No Known Drug Allergies  [06/26/2020]: Allergies Reconciled   Medication History Santiago Glad, [place]; 06/26/2020 3:06 PM) No Current Medications Medications Reconciled  Social History Santiago Glad, [place]; 06/26/2020 3:05 PM) No drug use  Tobacco use  Never  smoker.  Family History Santiago Glad, [place]; 06/26/2020 3:05 PM) Hypertension  Mother.  Pregnancy / Birth History Santiago Glad, [place]; 06/26/2020 3:05 PM) Rhett Bannister  2 Length (months) of breastfeeding  7-12  Other Problems Santiago Glad, CMA; 06/26/2020 3:05 PM) Back Pain  Cholelithiasis  Gastroesophageal Reflux Disease  Hemorrhoids     Review of Systems Santiago Glad CMA; 06/26/2020 3:05 PM) General Not Present- Appetite Loss, Chills, Fatigue, Fever, Night Sweats, Weight Gain and Weight Loss. Cardiovascular Not Present- Chest Pain, Difficulty Breathing Lying Down, Leg Cramps, Palpitations, Rapid Heart Rate, Shortness of Breath and Swelling of Extremities. Female Genitourinary Not Present- Frequency, Nocturia, Painful Urination, Pelvic Pain and Urgency. Musculoskeletal Not Present- Back Pain, Joint Pain, Joint Stiffness, Muscle Pain, Muscle Weakness and Swelling of Extremities. Neurological Not Present- Decreased Memory, Fainting, Headaches, Numbness, Seizures, Tingling, Tremor, Trouble walking and Weakness.  Physical Exam: Today's Vitals   07/27/20 1140  PainSc: 2    There is no height or weight on file to calculate BMI. General: pleasant, WD, WN white female who is sitting up in NAD HEENT: head is normocephalic, atraumatic.  Sclera are noninjected.  PERRL.  Ears and nose without any masses or lesions.  Mouth is pink and moist Heart: regular, rate, and rhythm.  Normal s1,s2. No obvious murmurs, gallops, or rubs noted.  Palpable radial and pedal pulses bilaterally Lungs: CTAB, no wheezes, rhonchi, or rales noted.  Respiratory effort nonlabored Abd: soft, mildly tender in RUQ, ND, +BS, no masses, hernias, or organomegaly MS: all 4 extremities are symmetrical with no cyanosis, clubbing, or edema. Skin: warm and dry with no masses, lesions, or rashes Neuro:  Cranial nerves 2-12 grossly intact, sensation is normal throughout Psych: A&Ox3 with an appropriate  affect.     Assessment & Plan Onalee Hua H. Ezzard Standing MD; 06/26/2020 3:57 PM) CHOLECYSTITIS (K81.9) Impression: Plan:  1. Lap cholecystectomy with cholangiogram today with Dr. Daphine Deutscher I have explained the procedure, risks, and aftercare of cholecystectomy.  Risks include but are not limited to bleeding, infection, wound problems, anesthesia, diarrhea, bile leak, injury to common bile duct/liver/intestine.  He/she seems to understand and agrees to proceed.  Letha Cape 12:11 PM 07/27/2020

## 2020-07-27 NOTE — Anesthesia Preprocedure Evaluation (Signed)
Anesthesia Evaluation  Patient identified by MRN, date of birth, ID band Patient awake    Reviewed: Allergy & Precautions, H&P , NPO status , Patient's Chart, lab work & pertinent test results  History of Anesthesia Complications Negative for: history of anesthetic complications  Airway Mallampati: II  TM Distance: >3 FB Neck ROM: Full    Dental no notable dental hx. (+) Dental Advisory Given, Teeth Intact   Pulmonary neg pulmonary ROS,    Pulmonary exam normal breath sounds clear to auscultation       Cardiovascular hypertension, Pt. on medications Normal cardiovascular exam Rhythm:Regular Rate:Normal     Neuro/Psych PSYCHIATRIC DISORDERS Depression negative neurological ROS     GI/Hepatic negative GI ROS, Neg liver ROS,   Endo/Other  negative endocrine ROS  Renal/GU negative Renal ROS     Musculoskeletal   Abdominal   Peds  Hematology negative hematology ROS (+)   Anesthesia Other Findings Prior C/S x1  Reproductive/Obstetrics                            Anesthesia Physical  Anesthesia Plan  ASA: II  Anesthesia Plan: General   Post-op Pain Management:    Induction: Intravenous  PONV Risk Score and Plan: 4 or greater and Ondansetron, Treatment may vary due to age or medical condition, Dexamethasone, Midazolam and Scopolamine patch - Pre-op  Airway Management Planned: Oral ETT  Additional Equipment: None  Intra-op Plan:   Post-operative Plan: Extubation in OR  Informed Consent: I have reviewed the patients History and Physical, chart, labs and discussed the procedure including the risks, benefits and alternatives for the proposed anesthesia with the patient or authorized representative who has indicated his/her understanding and acceptance.     Dental advisory given  Plan Discussed with: CRNA  Anesthesia Plan Comments:         Anesthesia Quick Evaluation

## 2020-07-27 NOTE — Op Note (Signed)
[name]  Primary Care Physician:  Natalia Leatherwood, DO    07/27/2020  4:14 PM  Procedure: Laparoscopic Cholecystectomy with intraoperative cholangiogram  Surgeon: Susy Frizzle B. Daphine Deutscher, MD, FACS Asst:  Ovidio Kin. MD, FACS  Anes:  General  Drains:  None  Findings: Acute cholecystitis;  IOC showed cystic duct off small right hepatic duct;  Pancreatic duct higher off CBD with retrograde filling  Description of Procedure: The patient was taken to OR 4 and given general anesthesia.  The patient was prepped with chlorohexidine prep and draped sterilely. A time out was performed including identifying the patient and discussing their procedure.  Access to the abdomen was achieved with a 5 mm Optiview through the right upper quadrant.  Port placement included three 5 mm trocars and one 11 mm upper midline trocar.    The gallbladder was visualized and appeared edematous.   The fundus of the gallbaldder was grasped and the gallbladder was elevated. Traction on the infundibulum allowed for successful demonstration of the critical view. Inflammatory changes were acute.  The area was dissected.  The cystic duct was identified and clipped up on the gallbladder and an incision was made in the cystic duct and the Reddick catheter was inserted after milking the cystic duct of any debris. A dynamic cholangiogram was performed which demonstrated intrahepatic filling and cystic duct coming off a small right hepatic radical.    The cystic duct was then triple clipped and divided, the cystic artery was double clipped and divided and then the gallbladder was removed from the gallbladder bed. Removal of the gallbladder from the gallbladder bed and demonstrated much edema.  The gallbladder was then placed in a bag and brought out through one of the trocar sites. The gallbladder bed was inspected and no bleeding or bile leaks were seen.   Laparoscopic visualization was used when closing fascial defects for trocar  sites with 0 vicryl and the PMI.   Incisions were injected with Exparel and closed with 4-0 Monocryl and Dermabond on the skin.  Sponge and needle count were correct.    The patient was taken to the recovery room in satisfactory condition.  Matt B. Daphine Deutscher, MD, FACS

## 2020-07-27 NOTE — Discharge Summary (Signed)
    Patient ID: April Bernard [id number] Feb 10, 1986 34 y.o.  Admit date: 07/27/2020 Discharge date: 07/28/2020  Admitting Diagnosis: cholecystitis  Discharge Diagnosis Patient Active Problem List   Diagnosis Date Noted  . Acute cholecystitis 07/27/2020  . Cholelithiases 06/26/2020  . Obesity (BMI 30-39.9) 09/18/2018  . Controlled substance agreement signed 06/06/2018  . 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 Ezzard Standing in the office a couple of week ago as detailed below. She had her second child by C-section about 4 months ago. About 2 months ago she started having attacks which started in her back, radiated to her right side, and then became subxiphoid. She has had 3 of these severe attacks. She's had milder attacks every several days. She has a gluten allergy. She has no prior history of stomach, liver, or colon disease. Her only prior abdominal surgery was a C-section. She has no family history of gallbladder disease. She is breast-feeding her daughter.  She went to the [facility] ER on 06/25/2020 for [place] on 06/25/2020 showed cholelithiasis with thickened gall bladder wall.    The patient has started having worsening pain since then and is having difficulty eating with N/V.  Due to these symptoms, she called our office yesterday and we recommended she be direct admitted for lap chole.  Procedures S/p lap chole with IOC, Dr. Daphine Deutscher 07/27/20  Hospital Course:  The patient was admitted and underwent a laparoscopic cholecystectomy with IOC.  The patient tolerated the procedure well.  On POD 1, the patient was tolerating a regular diet, voiding well, mobilizing, and pain was controlled with oral pain medications.  The patient was stable for DC home at this time with appropriate follow up made.   Physical Exam: Abd: soft, appropriately tender, +BS, ND,  incisions c/d/i  Allergies as of 07/28/2020   No Known Allergies     Medication List    TAKE these medications   acetaminophen 500 MG tablet Commonly known as: TYLENOL Take 2 tablets (1,000 mg total) by mouth every 6 (six) hours.   buPROPion 150 MG 24 hr tablet Commonly known as: WELLBUTRIN XL Take 150 mg by mouth daily.   ibuprofen 600 MG tablet Commonly known as: ADVIL Take 1 tablet (600 mg total) by mouth every 8 (eight) hours as needed for headache, mild pain or moderate pain (you can start this 2 hours after Tylenol is started.).   oxyCODONE 5 MG immediate release tablet Commonly known as: Oxy IR/ROXICODONE Take 1 tablet (5 mg total) by mouth every 4 (four) hours as needed for moderate pain.   prenatal multivitamin Tabs tablet Take 1 tablet by mouth daily at 12 noon.         Follow-up Information    [facility] Follow up on 08/13/2020.   Specialty: General Surgery Why: 2 pm, arrive by 1:30pm for paperwork and check in process.  please bring photo ID and insurance card Contact information: [address] [place] [address] [phone]               Signed: Barnetta Chapel, [facility] 07/28/2020, 9:05 AM Please see Amion for pager number during day hours 7:00am-4:30pm, 7-11:30am on Weekends

## 2020-07-27 NOTE — Transfer of Care (Signed)
Immediate Anesthesia Transfer of Care Note  Patient: April Bernard  Procedure(s) Performed: LAPAROSCOPIC CHOLECYSTECTOMY WITH POSSIBLE  INTRAOPERATIVE CHOLANGIOGRAM (N/A Abdomen)  Patient Location: PACU  Anesthesia Type:General  Level of Consciousness: awake, alert , oriented and patient cooperative  Airway & Oxygen Therapy: Patient Spontanous Breathing and Patient connected to face mask  Post-op Assessment: Report given to RN and Post -op Vital signs reviewed and stable  Post vital signs: Reviewed and stable  Last Vitals:  Vitals Value Taken Time  BP 140/83 07/27/20 1622  Temp    Pulse 88 07/27/20 1623  Resp 12 07/27/20 1623  SpO2 100 % 07/27/20 1623  Vitals shown include unvalidated device data.  Last Pain:  Vitals:   07/27/20 1140  PainSc: 2       Patients Stated Pain Goal: 3 (07/27/20 1140)  Complications: No complications documented.

## 2020-07-27 NOTE — Anesthesia Postprocedure Evaluation (Signed)
Anesthesia Post Note  Patient: April Bernard  Procedure(s) Performed: LAPAROSCOPIC CHOLECYSTECTOMY WITH POSSIBLE  INTRAOPERATIVE CHOLANGIOGRAM (N/A Abdomen)     Patient location during evaluation: PACU Anesthesia Type: General Level of consciousness: sedated and patient cooperative Pain management: pain level controlled Vital Signs Assessment: post-procedure vital signs reviewed and stable Respiratory status: spontaneous breathing Cardiovascular status: stable Anesthetic complications: no   No complications documented.  Last Vitals:  Vitals:   07/27/20 1645 07/27/20 1700  BP: (!) 141/89 140/89  Pulse: 68 72  Resp: 13 12  Temp:    SpO2: 100% 100%    Last Pain:  Vitals:   07/27/20 1700  PainSc: Asleep                 Lewie Loron

## 2020-07-27 NOTE — Anesthesia Procedure Notes (Signed)
Procedure Name: Intubation Date/Time: 07/27/2020 2:32 PM Performed by: Vanessa Parc, CRNA Pre-anesthesia Checklist: Patient identified, Emergency Drugs available, Suction available and Patient being monitored Patient Re-evaluated:Patient Re-evaluated prior to induction Oxygen Delivery Method: Circle system utilized Preoxygenation: Pre-oxygenation with 100% oxygen Induction Type: IV induction Ventilation: Mask ventilation without difficulty Laryngoscope Size: 2 and Miller Grade View: Grade I Tube type: Oral Tube size: 7.0 mm Number of attempts: 1 Airway Equipment and Method: Stylet Placement Confirmation: ETT inserted through vocal cords under direct vision,  positive ETCO2 and breath sounds checked- equal and bilateral Secured at: 22 cm Tube secured with: Tape Dental Injury: Teeth and Oropharynx as per pre-operative assessment

## 2020-07-27 NOTE — Progress Notes (Signed)
VAST consulted to obtain IV access. Upon arrival at bedside, pt had been taken to surgery.

## 2020-07-28 ENCOUNTER — Encounter (HOSPITAL_COMMUNITY): Payer: Self-pay | Admitting: Surgery

## 2020-07-28 DIAGNOSIS — K819 Cholecystitis, unspecified: Secondary | ICD-10-CM | POA: Diagnosis not present

## 2020-07-28 MED ORDER — IBUPROFEN 600 MG PO TABS: 600.0000 mg | ORAL_TABLET | Freq: Three times a day (TID) | ORAL | 0 refills | Status: DC | PRN

## 2020-07-28 MED ORDER — OXYCODONE HCL 5 MG PO TABS
5.0000 mg | ORAL_TABLET | ORAL | 0 refills | Status: DC | PRN
Start: 2020-07-28 — End: 2021-03-04

## 2020-07-28 MED ORDER — ACETAMINOPHEN 500 MG PO TABS
1000.0000 mg | ORAL_TABLET | Freq: Four times a day (QID) | ORAL | 0 refills | Status: DC
Start: 2020-07-28 — End: 2021-03-04

## 2020-07-29 LAB — SURGICAL PATHOLOGY

## 2020-08-11 ENCOUNTER — Encounter (HOSPITAL_COMMUNITY): Admission: RE | Admit: 2020-08-11 | Payer: Commercial Managed Care - PPO | Source: Ambulatory Visit

## 2020-08-17 ENCOUNTER — Other Ambulatory Visit (HOSPITAL_COMMUNITY): Payer: Commercial Managed Care - PPO

## 2020-08-20 ENCOUNTER — Encounter (HOSPITAL_COMMUNITY): Admission: RE | Payer: Self-pay | Source: Ambulatory Visit

## 2020-08-20 ENCOUNTER — Ambulatory Visit (HOSPITAL_COMMUNITY): Admission: RE | Admit: 2020-08-20 | Payer: Commercial Managed Care - PPO | Source: Ambulatory Visit | Admitting: Surgery

## 2020-08-20 SURGERY — LAPAROSCOPIC CHOLECYSTECTOMY WITH INTRAOPERATIVE CHOLANGIOGRAM
Anesthesia: General

## 2021-03-03 ENCOUNTER — Other Ambulatory Visit: Payer: Self-pay

## 2021-03-04 ENCOUNTER — Encounter: Payer: Self-pay | Admitting: Family Medicine

## 2021-03-04 ENCOUNTER — Ambulatory Visit (INDEPENDENT_AMBULATORY_CARE_PROVIDER_SITE_OTHER): Payer: Commercial Managed Care - PPO | Admitting: Family Medicine

## 2021-03-04 VITALS — BP 111/75 | HR 79 | Temp 97.5°F | Ht 63.5 in | Wt 187.0 lb

## 2021-03-04 DIAGNOSIS — E559 Vitamin D deficiency, unspecified: Secondary | ICD-10-CM | POA: Insufficient documentation

## 2021-03-04 DIAGNOSIS — Z130 Encounter for screening for diseases of the blood and blood-forming organs and certain disorders involving the immune mechanism: Secondary | ICD-10-CM | POA: Diagnosis not present

## 2021-03-04 DIAGNOSIS — Z131 Encounter for screening for diabetes mellitus: Secondary | ICD-10-CM

## 2021-03-04 DIAGNOSIS — Z0000 Encounter for general adult medical examination without abnormal findings: Secondary | ICD-10-CM | POA: Diagnosis not present

## 2021-03-04 DIAGNOSIS — Z1159 Encounter for screening for other viral diseases: Secondary | ICD-10-CM | POA: Diagnosis not present

## 2021-03-04 DIAGNOSIS — E669 Obesity, unspecified: Secondary | ICD-10-CM | POA: Diagnosis not present

## 2021-03-04 DIAGNOSIS — Z79899 Other long term (current) drug therapy: Secondary | ICD-10-CM

## 2021-03-04 DIAGNOSIS — F988 Other specified behavioral and emotional disorders with onset usually occurring in childhood and adolescence: Secondary | ICD-10-CM

## 2021-03-04 LAB — LIPID PANEL
Cholesterol: 238 mg/dL — ABNORMAL HIGH (ref 0–200)
HDL: 68.2 mg/dL (ref >39.00)
LDL Cholesterol: 152 mg/dL — ABNORMAL HIGH (ref 0–99)
NonHDL: 170.02
Total CHOL/HDL Ratio: 3
Triglycerides: 88 mg/dL (ref 0.0–149.0)
VLDL: 17.6 mg/dL (ref 0.0–40.0)

## 2021-03-04 LAB — COMPREHENSIVE METABOLIC PANEL
ALT: 34 U/L (ref 0–35)
AST: 18 U/L (ref 0–37)
Albumin: 4.5 g/dL (ref 3.5–5.2)
Alkaline Phosphatase: 99 U/L (ref 39–117)
BUN: 15 mg/dL (ref 6–23)
CO2: 26 mEq/L (ref 19–32)
Calcium: 9.3 mg/dL (ref 8.4–10.5)
Chloride: 103 mEq/L (ref 96–112)
Creatinine, Ser: 0.73 mg/dL (ref 0.40–1.20)
GFR: 105.96 mL/min (ref >60.00)
Glucose, Bld: 86 mg/dL (ref 70–99)
Potassium: 4.1 mEq/L (ref 3.5–5.1)
Sodium: 138 mEq/L (ref 135–145)
Total Bilirubin: 0.6 mg/dL (ref 0.2–1.2)
Total Protein: 7.2 g/dL (ref 6.0–8.3)

## 2021-03-04 LAB — CBC WITH DIFFERENTIAL/PLATELET
Basophils Absolute: 0 10*3/uL (ref 0.0–0.1)
Basophils Relative: 0.7 % (ref 0.0–3.0)
Eosinophils Absolute: 0.2 10*3/uL (ref 0.0–0.7)
Eosinophils Relative: 2.7 % (ref 0.0–5.0)
HCT: 39.7 % (ref 36.0–46.0)
Hemoglobin: 13.2 g/dL (ref 12.0–15.0)
Lymphocytes Relative: 36.3 % (ref 12.0–46.0)
Lymphs Abs: 2.5 10*3/uL (ref 0.7–4.0)
MCHC: 33.2 g/dL (ref 30.0–36.0)
MCV: 86.7 fl (ref 78.0–100.0)
Monocytes Absolute: 0.3 10*3/uL (ref 0.1–1.0)
Monocytes Relative: 4.4 % (ref 3.0–12.0)
Neutro Abs: 3.8 10*3/uL (ref 1.4–7.7)
Neutrophils Relative %: 55.9 % (ref 43.0–77.0)
Platelets: 269 10*3/uL (ref 150.0–400.0)
RBC: 4.58 Mil/uL (ref 3.87–5.11)
RDW: 13.1 % (ref 11.5–15.5)
WBC: 6.8 10*3/uL (ref 4.0–10.5)

## 2021-03-04 LAB — TSH: TSH: 0.87 u[IU]/mL (ref 0.35–4.50)

## 2021-03-04 LAB — HEMOGLOBIN A1C: Hgb A1c MFr Bld: 5.5 % (ref 4.6–6.5)

## 2021-03-04 LAB — VITAMIN D 25 HYDROXY (VIT D DEFICIENCY, FRACTURES): VITD: 24.55 ng/mL — ABNORMAL LOW (ref 30.00–100.00)

## 2021-03-04 MED ORDER — AMPHETAMINE-DEXTROAMPHET ER 20 MG PO CP24: 20.0000 mg | ORAL_CAPSULE | Freq: Every day | ORAL | 0 refills | Status: DC

## 2021-03-04 NOTE — Progress Notes (Signed)
This visit occurred during the SARS-CoV-2 public health emergency.  Safety protocols were in place, including screening questions prior to the visit, additional usage of staff PPE, and extensive cleaning of exam room while observing appropriate contact time as indicated for disinfecting solutions.    Patient ID: April Bernard, female  DOB: 1985-11-11, 35 y.o.   MRN: [id number] Patient Care Team    Relationship Specialty Notifications Start End  Natalia Leatherwood, DO PCP - General Family Medicine  06/06/18   Olivia Mackie, MD Consulting Physician Obstetrics and Gynecology  05/15/20     Chief Complaint  Patient presents with  . Annual Exam    Pt is fasting  . ADHD    Subjective: April Bernard is a 35 y.o.  Female  present for CPE/CMC. All past medical history, surgical history, allergies, family history, immunizations, medications and social history were updated in the electronic medical record today. All recent labs, ED visits and hospitalizations within the last year were reviewed.  Patient had a baby 1 year ago today, little girl named April Bernard.  She returns today for her physical and she would also like to restart her attention deficit medicines now that she is no longer pregnant or lactating.  Health maintenance:  Colonoscopy: no fhx- routine screen 45. Mammogram: FHx, PGM. Routine screen 40  Cervical cancer screening: last pap: 2021, completed by: referral in place. (Dr. Rosemary Holms) Immunizations: tdap 2021 UTD, Influenza 2021 UTD (encouraged yearly), covid x3 Infectious disease screening: HIV completed 2021 and hep c screen today.  DEXA: routine screen Assistive device: none Oxygen GDJ:MEQA Patient has a Dental home. Hospitalizations/ED visits: reviewed  ADD:  Pt reports she was diagnosed with ADD in middle school. She has been on Adderall off and on over the years, with discontinuation in high school for a little while and when she lost insurance. She was established with a  psychiatrist in 2013 that restarted the medication for her.  Patient discontinued her Adderall upon pregnancy.  She is no longer pregnant or breast-feeding and would like to restart her Adderall ER 20 mg daily. Indication: ADD Medication and dose: Adderall XR 20 mg QD # pills per month: 30 Last UDS date: next visit contract signed (Y/N): Y Date narcotic database last reviewed (include red flags):03/04/21   Depression screen [facility] 2/9 03/04/2021 05/20/2019 11/28/2018 09/18/2018 06/06/2018  Decreased Interest 0 0 0 0 0  Down, Depressed, Hopeless 0 0 0 0 0  PHQ - 2 Score 0 0 0 0 0  Altered sleeping - 0 - - 0  Tired, decreased energy - 0 - - 0  Change in appetite - 0 - - 1  Feeling bad or failure about yourself  - 0 - - 0  Trouble concentrating - 0 - - 0  Moving slowly or fidgety/restless - 0 - - 0  Suicidal thoughts - 0 - - 0  PHQ-9 Score - 0 - - 1   GAD 7 : Generalized Anxiety Score 05/20/2019  Nervous, Anxious, on Edge 0  Control/stop worrying 0  Worry too much - different things 0  Trouble relaxing 0  Restless 0  Easily annoyed or irritable 0  Afraid - awful might happen 0  Total GAD 7 Score 0   Immunization History  Administered Date(s) Administered  . Influenza,inj,Quad PF,6+ Mos 09/19/2017, 09/01/2018  . Influenza-Unspecified 08/24/2015, 08/23/2020  . PFIZER(Purple Top)SARS-COV-2 Vaccination 01/05/2020, 02/05/2020, 08/23/2020  . Tdap 05/08/2014, 12/02/2019   Past Medical History:  Diagnosis Date  . Acute cholecystitis 07/27/2020  .  ADD (attention deficit disorder)   . Allergic rhinitis   . Chicken pox   . Cholelithiases 06/26/2020  . Depression   . Ganglion cyst of right foot   . History of pregnancy induced hypertension   . Preeclampsia    2018  . Vaginal Pap smear, abnormal    No Known Allergies Past Surgical History:  Procedure Laterality Date  . CESAREAN SECTION  11/08/2016  . CESAREAN SECTION N/A 03/04/2020   Procedure: Repeat CESAREAN SECTION;  Surgeon: Olivia Mackie, MD;  Location: [facility] ORS;  Service: Obstetrics;  Laterality: N/A;  EDD: 03/10/20  . CHOLECYSTECTOMY N/A 07/27/2020   Procedure: LAPAROSCOPIC CHOLECYSTECTOMY WITH POSSIBLE  INTRAOPERATIVE CHOLANGIOGRAM;  Surgeon: Luretha Murphy, MD;  Location: [facility] ORS;  Service: General;  Laterality: N/A;  . REFRACTIVE SURGERY Bilateral 2011   Family History  Problem Relation Age of Onset  . Arthritis Mother   . Hypertension Mother   . Hyperlipidemia Father   . Arthritis Maternal Grandmother   . Hypertension Maternal Grandmother   . Hypertension Maternal Grandfather   . Heart disease Maternal Grandfather   . Diabetes Maternal Grandfather   . Hypertension Paternal Grandmother   . Hyperlipidemia Paternal Grandmother   . Breast cancer Paternal Grandmother   . Hypertension Paternal Grandfather   . Hyperlipidemia Paternal Grandfather   . Prostate cancer Paternal Grandfather    Social History   Social History Narrative   Marital status/children/pets: married, 1 child.    Education/employment: B.S. Degree, works in [employer].   Safety:      -Wears a bicycle helmet riding a bike: No     -smoke alarm in the home:Yes     - wears seatbelt: Yes     - Feels safe in their relationships: Yes    Allergies as of 03/04/2021   No Known Allergies     Medication List       Accurate as of Mar 04, 2021 12:53 PM. If you have any questions, ask your nurse or doctor.        STOP taking these medications   acetaminophen 500 MG tablet Commonly known as: TYLENOL Stopped by: Felix Pacini, DO   buPROPion 150 MG 24 hr tablet Commonly known as: WELLBUTRIN XL Stopped by: Felix Pacini, DO   ibuprofen 600 MG tablet Commonly known as: ADVIL Stopped by: Felix Pacini, DO   oxyCODONE 5 MG immediate release tablet Commonly known as: Oxy IR/ROXICODONE Stopped by: Felix Pacini, DO     TAKE these medications   amphetamine-dextroamphetamine 20 MG 24 hr capsule Commonly known as: Adderall XR Take 1  capsule (20 mg total) by mouth daily. Started by: Felix Pacini, DO   amphetamine-dextroamphetamine 20 MG 24 hr capsule Commonly known as: Adderall XR Take 1 capsule (20 mg total) by mouth daily. Started by: Felix Pacini, DO   amphetamine-dextroamphetamine 20 MG 24 hr capsule Commonly known as: Adderall XR Take 1 capsule (20 mg total) by mouth daily. Started by: Felix Pacini, DO   prenatal multivitamin Tabs tablet Take 1 tablet by mouth daily at 12 noon.       All past medical history, surgical history, allergies, family history, immunizations andmedications were updated in the EMR today and reviewed under the history and medication portions of their EMR.     No results found for this or any previous visit (from the past 2160 hour(s)).   ROS: 14 pt review of systems performed and negative (unless mentioned in an HPI)  Objective: BP 111/75  Pulse 79   Temp (!) 97.5 F (36.4 C) (Oral)   Ht 5' 3.5" (1.613 m)   Wt 187 lb (84.8 kg)   LMP 02/26/2021   SpO2 100%   BMI 32.61 kg/m  Gen: Afebrile. No acute distress. Nontoxic in appearance, well-developed, well-nourished, very pleasant female HENT: AT. [place]. Bilateral TM visualized and normal in appearance, normal external auditory canal. MMM, no oral lesions, adequate dentition. Bilateral nares within normal limits. Throat without erythema, ulcerations or exudates.  No cough on exam, no hoarseness on exam. Eyes:Pupils Equal Round Reactive to light, Extraocular movements intact,  Conjunctiva without redness, discharge or icterus. Neck/lymp/endocrine: Supple, no lymphadenopathy, no thyromegaly CV: RRR no murmur, no edema, +2/4 P posterior tibialis pulses.  Chest: CTAB, no wheeze, rhonchi or crackles.  Normal respiratory effort.  Good air movement. Abd: Soft.  Flat. NTND. BS present.  No masses palpated. No hepatosplenomegaly. No rebound tenderness or guarding. Skin: No rashes, purpura or petechiae. Warm and well-perfused. Skin  intact. Neuro/Msk:  Normal gait. PERLA. EOMi. Alert. Oriented x3.  Cranial nerves II through XII intact. Muscle strength 5/5 upper/lower extremity. DTRs equal bilaterally. Psych: Normal affect, dress and demeanor. Normal speech. Normal thought content and judgment.   No exam data present  Assessment/plan: April BlacksmithLeanne Pidcock is a 35 y.o. female present for CPE/CMC Attention deficit disorder (ADD) without hyperactivity/Controlled substance agreement signed -Had been stable on medication.  Has been off medication for some time secondary to pregnancy and breast-feeding.  Will restart for her and follow-up in a few months. Insurance required 30 d scripts only.  Indication: ADD Medication and dose: Adderall XR 20 mg QD # pills per month: 30 Last UDS date: Complaint next appointment contract signed (Y/N): y- Date narcotic database last reviewed (include red flags):03/04/21 Refills provided today for 3 mos - follow up 3 mos - tsh Obesity (BMI 30-39.9) - Lipid panel Screening for deficiency anemia - CBC with Differential/Platelet Need for hepatitis C screening test - Hepatitis C Antibody Diabetes mellitus screening - Comprehensive metabolic panel - Hemoglobin A1c Vitamin D deficiency - past h/o of vit d def and has been BF for 1 yr. Will check levels today.  Vitamin D (25 hydroxy)  Routine general medical examination at a health care facility Colonoscopy: no fhx- routine screen 45. Mammogram: FHx, PGM. Routine screen 40  Cervical cancer screening: last pap: 2021, completed by: referral in place. (Dr. Rosemary Holmsavon) Immunizations: tdap 2021 UTD, Influenza 2021 UTD (encouraged yearly), covid x3 Infectious disease screening: HIV completed 2021 and hep c screen today.  DEXA: routine screen Patient was encouraged to exercise greater than 150 minutes a week. Patient was encouraged to choose a diet filled with fresh fruits and vegetables, and lean meats. AVS provided to patient today for  education/recommendation on gender specific health and safety maintenance.  Return in about 3 months (around 06/04/2021) for CMC (30 min) and 1 yr cpe.   Orders Placed This Encounter  Procedures  . CBC with Differential/Platelet  . Comprehensive metabolic panel  . Hemoglobin A1c  . Lipid panel  . TSH  . Hepatitis C Antibody  . Vitamin D (25 hydroxy)   Meds ordered this encounter  Medications  . amphetamine-dextroamphetamine (ADDERALL XR) 20 MG 24 hr capsule    Sig: Take 1 capsule (20 mg total) by mouth daily.    Dispense:  30 capsule    Refill:  0  . amphetamine-dextroamphetamine (ADDERALL XR) 20 MG 24 hr capsule    Sig: Take 1 capsule (20 mg  total) by mouth daily.    Dispense:  30 capsule    Refill:  0    May refill after 04/21/2021  . amphetamine-dextroamphetamine (ADDERALL XR) 20 MG 24 hr capsule    Sig: Take 1 capsule (20 mg total) by mouth daily.    Dispense:  30 capsule    Refill:  0    May refill after 03/28/2021   Referral Orders  No referral(s) requested today     Electronically signed by: Felix Pacini, DO [facility]

## 2021-03-04 NOTE — Patient Instructions (Signed)
Great to see you today.  I have refilled the medication(s) we provide.   If labs were collected, we will inform you of lab results once received either by echart message or telephone call.   - echart message- for normal results that have been seen by the patient already.   - telephone call: abnormal results or if patient has not viewed results in their echart.    Health Maintenance, Female Adopting a healthy lifestyle and getting preventive care are important in promoting health and wellness. Ask your health care provider about:  The right schedule for you to have regular tests and exams.  Things you can do on your own to prevent diseases and keep yourself healthy. What should I know about diet, weight, and exercise? Eat a healthy diet  Eat a diet that includes plenty of vegetables, fruits, low-fat dairy products, and lean protein.  Do not eat a lot of foods that are high in solid fats, added sugars, or sodium.   Maintain a healthy weight Body mass index (BMI) is used to identify weight problems. It estimates body fat based on height and weight. Your health care provider can help determine your BMI and help you achieve or maintain a healthy weight. Get regular exercise Get regular exercise. This is one of the most important things you can do for your health. Most adults should:  Exercise for at least 150 minutes each week. The exercise should increase your heart rate and make you sweat (moderate-intensity exercise).  Do strengthening exercises at least twice a week. This is in addition to the moderate-intensity exercise.  Spend less time sitting. Even light physical activity can be beneficial. Watch cholesterol and blood lipids Have your blood tested for lipids and cholesterol at 36 years of age, then have this test every 5 years. Have your cholesterol levels checked more often if:  Your lipid or cholesterol levels are high.  You are older than 36 years of age.  You are at  high risk for heart disease. What should I know about cancer screening? Depending on your health history and family history, you may need to have cancer screening at various ages. This may include screening for:  Breast cancer.  Cervical cancer.  Colorectal cancer.  Skin cancer.  Lung cancer. What should I know about heart disease, diabetes, and high blood pressure? Blood pressure and heart disease  High blood pressure causes heart disease and increases the risk of stroke. This is more likely to develop in people who have high blood pressure readings, are of African descent, or are overweight.  Have your blood pressure checked: ? Every 3-5 years if you are 80-47 years of age. ? Every year if you are 60 years old or older. Diabetes Have regular diabetes screenings. This checks your fasting blood sugar level. Have the screening done:  Once every three years after age 24 if you are at a normal weight and have a low risk for diabetes.  More often and at a younger age if you are overweight or have a high risk for diabetes. What should I know about preventing infection? Hepatitis B If you have a higher risk for hepatitis B, you should be screened for this virus. Talk with your health care provider to find out if you are at risk for hepatitis B infection. Hepatitis C Testing is recommended for:  Everyone born from [birth year] through 1966.  Anyone with known risk factors for hepatitis C. Sexually transmitted infections (STIs)  Get screened for  STIs, including gonorrhea and chlamydia, if: ? You are sexually active and are younger than 36 years of age. ? You are older than 36 years of age and your health care provider tells you that you are at risk for this type of infection. ? Your sexual activity has changed since you were last screened, and you are at increased risk for chlamydia or gonorrhea. Ask your health care provider if you are at risk.  Ask your health care provider about whether  you are at high risk for HIV. Your health care provider may recommend a prescription medicine to help prevent HIV infection. If you choose to take medicine to prevent HIV, you should first get tested for HIV. You should then be tested every 3 months for as long as you are taking the medicine. Pregnancy  If you are about to stop having your period (premenopausal) and you may become pregnant, seek counseling before you get pregnant.  Take 400 to 800 micrograms (mcg) of folic acid every day if you become pregnant.  Ask for birth control (contraception) if you want to prevent pregnancy. Osteoporosis and menopause Osteoporosis is a disease in which the bones lose minerals and strength with aging. This can result in bone fractures. If you are 65 years old or older, or if you are at risk for osteoporosis and fractures, ask your health care provider if you should:  Be screened for bone loss.  Take a calcium or vitamin D supplement to lower your risk of fractures.  Be given hormone replacement therapy (HRT) to treat symptoms of menopause. Follow these instructions at home: Lifestyle  Do not use any products that contain nicotine or tobacco, such as cigarettes, e-cigarettes, and chewing tobacco. If you need help quitting, ask your health care provider.  Do not use street drugs.  Do not share needles.  Ask your health care provider for help if you need support or information about quitting drugs. Alcohol use  Do not drink alcohol if: ? Your health care provider tells you not to drink. ? You are pregnant, may be pregnant, or are planning to become pregnant.  If you drink alcohol: ? Limit how much you use to 0-1 drink a day. ? Limit intake if you are breastfeeding.  Be aware of how much alcohol is in your drink. In the [place] one drink equals one 12 oz bottle of beer (355 mL), one 5 oz glass of wine (148 mL), or one 1 oz glass of hard liquor (44 mL). General instructions  Schedule regular  health, dental, and eye exams.  Stay current with your vaccines.  Tell your health care provider if: ? You often feel depressed. ? You have ever been abused or do not feel safe at home. Summary  Adopting a healthy lifestyle and getting preventive care are important in promoting health and wellness.  Follow your health care provider's instructions about healthy diet, exercising, and getting tested or screened for diseases.  Follow your health care provider's instructions on monitoring your cholesterol and blood pressure. This information is not intended to replace advice given to you by your health care provider. Make sure you discuss any questions you have with your health care provider. Document Revised: 10/10/2018 Document Reviewed: 10/10/2018 Elsevier Patient Education  2021 [employer]vier Inc.  

## 2021-03-05 ENCOUNTER — Telehealth: Payer: Self-pay | Admitting: Family Medicine

## 2021-03-05 ENCOUNTER — Ambulatory Visit: Payer: Commercial Managed Care - PPO | Admitting: Family Medicine

## 2021-03-05 LAB — HEPATITIS C ANTIBODY
Hepatitis C Ab: NONREACTIVE
SIGNAL TO CUT-OFF: 0.01 (ref <1.00)

## 2021-03-05 NOTE — Telephone Encounter (Signed)
Please call patient: All of her labs are normal with the exception of a low vitamin D of 24.  I recommend she had 1000 units of vitamin D daily. Her cholesterol is also borderline with an LDL/bad cholesterol of 152.  Her good cholesterol/HDL is excellent and this is cardiovascular protective.    -I would recommend she attempt to increase her exercise to at least 150 minutes a week of cardiovascular exercise.  Make dietary changes were capable by decreasing red meats, butters, fatty meats and unhealthy oils.  When using oils olive oil or canola oil was best.  Increase the fiber in her diet will help, can add 1-2 servings of Metamucil a day. -No medications are needed at this time for her cholesterol however if increases above 160 we would need to consider adding a medication for cardiovascular protection and help her lower her cholesterol.

## 2021-03-05 NOTE — Telephone Encounter (Signed)
Spoke with pt regarding labs and instructions.   

## 2021-05-04 ENCOUNTER — Telehealth: Payer: Self-pay

## 2021-05-04 NOTE — Telephone Encounter (Signed)
FYI

## 2021-05-04 NOTE — Telephone Encounter (Signed)
Pt called because she is needing Rx for adderall sent to a different pharmacy because her pharmacy does not have medication in stock. I advised pt to contact requested pharmacy to make sure they have it in stock before we request Rx to be sent. Pt is to call back to let us know which pharmacy Rx may be sent to. Advised pt that Rx may not be sent due to it being a controlled substance be would forward her request.

## 2021-05-04 NOTE — Telephone Encounter (Signed)
Noted  

## 2021-06-01 ENCOUNTER — Telehealth: Payer: Self-pay | Admitting: Family Medicine

## 2021-06-01 NOTE — Telephone Encounter (Signed)
UDS is due. Please advise

## 2021-06-01 NOTE — Telephone Encounter (Signed)
Patient scheduled through Mychart, a VV for "Adderall medication refill."  Please advise if VV is ok or need to change to OV.

## 2021-06-01 NOTE — Telephone Encounter (Signed)
Virtual ok

## 2021-06-02 NOTE — Telephone Encounter (Signed)
FYI

## 2021-06-04 ENCOUNTER — Encounter: Payer: Self-pay | Admitting: Family Medicine

## 2021-06-04 ENCOUNTER — Telehealth: Payer: Commercial Managed Care - PPO | Admitting: Family Medicine

## 2021-06-04 DIAGNOSIS — Z79899 Other long term (current) drug therapy: Secondary | ICD-10-CM

## 2021-06-04 DIAGNOSIS — F988 Other specified behavioral and emotional disorders with onset usually occurring in childhood and adolescence: Secondary | ICD-10-CM | POA: Diagnosis not present

## 2021-06-04 MED ORDER — AMPHETAMINE-DEXTROAMPHET ER 20 MG PO CP24
20.0000 mg | ORAL_CAPSULE | Freq: Every day | ORAL | 0 refills | Status: DC
Start: 2021-06-04 — End: 2021-09-15

## 2021-06-04 NOTE — Progress Notes (Signed)
VIRTUAL VISIT VIA VIDEO  I connected with April Bernard on 06/04/21 at  9:30 AM EDT by a video enabled telemedicine application and verified that I am speaking with the correct person using two identifiers. Location patient: Home Location provider: [facility], Office Persons participating in the virtual visit: Patient, Dr. Claiborne Billings and Les Pou, CMA  I discussed the limitations of evaluation and management by telemedicine and the availability of in person appointments. The patient expressed understanding and agreed to proceed.     Patient ID: April Bernard, female  DOB: 17-Aug-1986, 35 y.o.   MRN: [id number] Patient Care Team    Relationship Specialty Notifications Start End  Natalia Leatherwood, DO PCP - General Family Medicine  06/06/18   Olivia Mackie, MD Consulting Physician Obstetrics and Gynecology  05/15/20     Chief Complaint  Patient presents with   ADHD    [facility]    Subjective: April Bernard is a 35 y.o.  Female  present for [facility] ADD/controlled substance agreement in place:  Pt reports she was diagnosed with ADD in middle school. She has been on Adderall off and on over the years, with discontinuation in high school, when she lost insurance  She was established with a psychiatrist in 2013 that restarted the medication for her.  Patient discontinued her Adderall upon pregnancy.  She is no longer pregnant or breast-feeding and would like to restart her Adderall ER 20 mg daily. Indication: ADD Medication and dose: Adderall XR 20 mg QD # pills per month: 30 Last UDS date: next visit contract signed (Y/N): Y Date narcotic database last reviewed (include red flags):06/04/21   Depression screen [facility] 2/9 03/04/2021 05/20/2019 11/28/2018 09/18/2018 06/06/2018  Decreased Interest 0 0 0 0 0  Down, Depressed, Hopeless 0 0 0 0 0  PHQ - 2 Score 0 0 0 0 0  Altered sleeping - 0 - - 0  Tired, decreased energy - 0 - - 0  Change in appetite - 0 - - 1  Feeling bad or failure about  yourself  - 0 - - 0  Trouble concentrating - 0 - - 0  Moving slowly or fidgety/restless - 0 - - 0  Suicidal thoughts - 0 - - 0  PHQ-9 Score - 0 - - 1   GAD 7 : Generalized Anxiety Score 05/20/2019  Nervous, Anxious, on Edge 0  Control/stop worrying 0  Worry too much - different things 0  Trouble relaxing 0  Restless 0  Easily annoyed or irritable 0  Afraid - awful might happen 0  Total GAD 7 Score 0   Immunization History  Administered Date(s) Administered   Influenza,inj,Quad PF,6+ Mos 09/19/2017, 09/01/2018   Influenza-Unspecified 08/24/2015, 08/23/2020   PFIZER(Purple Top)SARS-COV-2 Vaccination 01/05/2020, 02/05/2020, 08/23/2020   Tdap 05/08/2014, 12/02/2019   Past Medical History:  Diagnosis Date   Acute cholecystitis 07/27/2020   ADD (attention deficit disorder)    Allergic rhinitis    Chicken pox    Cholelithiases 06/26/2020   Depression    Ganglion cyst of right foot    History of pregnancy induced hypertension    Preeclampsia    2018   Vaginal Pap smear, abnormal    No Known Allergies Past Surgical History:  Procedure Laterality Date   CESAREAN SECTION  11/08/2016   CESAREAN SECTION N/A 03/04/2020   Procedure: Repeat CESAREAN SECTION;  Surgeon: Olivia Mackie, MD;  Location: [facility];  Service: Obstetrics;  Laterality: N/A;  EDD: 03/10/20   CHOLECYSTECTOMY  N/A 07/27/2020   Procedure: LAPAROSCOPIC CHOLECYSTECTOMY WITH POSSIBLE  INTRAOPERATIVE CHOLANGIOGRAM;  Surgeon: Luretha Murphy, MD;  Location: [facility] ORS;  Service: General;  Laterality: N/A;   REFRACTIVE SURGERY Bilateral 2011   Family History  Problem Relation Age of Onset   Arthritis Mother    Hypertension Mother    Hyperlipidemia Father    Arthritis Maternal Grandmother    Hypertension Maternal Grandmother    Hypertension Maternal Grandfather    Heart disease Maternal Grandfather    Diabetes Maternal Grandfather    Hypertension Paternal Grandmother    Hyperlipidemia Paternal Grandmother    Breast  cancer Paternal Grandmother    Hypertension Paternal Grandfather    Hyperlipidemia Paternal Grandfather    Prostate cancer Paternal Grandfather    Social History   Social History Narrative   Marital status/children/pets: married, 1 child.    Education/employment: B.S. Degree, works in [employer].   Safety:      -Wears a bicycle helmet riding a bike: No     -smoke alarm in the home:Yes     - wears seatbelt: Yes     - Feels safe in their relationships: Yes    Allergies as of 06/04/2021   No Known Allergies      Medication List        Accurate as of June 04, 2021  9:57 AM. If you have any questions, ask your nurse or doctor.          amphetamine-dextroamphetamine 20 MG 24 hr capsule Commonly known as: Adderall XR Take 1 capsule (20 mg total) by mouth daily.   amphetamine-dextroamphetamine 20 MG 24 hr capsule Commonly known as: Adderall XR Take 1 capsule (20 mg total) by mouth daily.   amphetamine-dextroamphetamine 20 MG 24 hr capsule Commonly known as: Adderall XR Take 1 capsule (20 mg total) by mouth daily.   prenatal multivitamin Tabs tablet Take 1 tablet by mouth daily at 12 noon.        All past medical history, surgical history, allergies, family history, immunizations andmedications were updated in the EMR today and reviewed under the history and medication portions of their EMR.     No results found for this or any previous visit (from the past 2160 hour(s)).   ROS: 14 pt review of systems performed and negative (unless mentioned in an HPI)  Objective: There were no vitals taken for this visit. Gen: Afebrile. No acute distress.  HENT: AT. [place].  Neuro:  Alert. Oriented x3 Psych: Normal affect, dress and demeanor. Normal speech. Normal thought content and judgment.   No results found.  Assessment/plan: April Bernard is a 35 y.o. female present for [facility] Attention deficit disorder (ADD) without hyperactivity/Controlled substance agreement  signed -stable.  Insurance required 30 d scripts only.   Indication: ADD Medication and dose: Adderall XR 20 mg QD # pills per month: 30 Last UDS date: Complaint next appointment contract signed (Y/N): y- Date narcotic database last reviewed (include red flags):06/04/21 Refills provided today for 3 mos - follow up 3 mos  Return in about 3 months (around 09/04/2021) for ADD.   No orders of the defined types were placed in this encounter.  Meds ordered this encounter  Medications   amphetamine-dextroamphetamine (ADDERALL XR) 20 MG 24 hr capsule    Sig: Take 1 capsule (20 mg total) by mouth daily.    Dispense:  30 capsule    Refill:  0   amphetamine-dextroamphetamine (ADDERALL XR) 20 MG 24 hr capsule    Sig: Take  1 capsule (20 mg total) by mouth daily.    Dispense:  30 capsule    Refill:  0    May refill approximately 60 days after prescribe date   amphetamine-dextroamphetamine (ADDERALL XR) 20 MG 24 hr capsule    Sig: Take 1 capsule (20 mg total) by mouth daily.    Dispense:  30 capsule    Refill:  0    May refill approximately 30 days after prescribe date    Referral Orders  No referral(s) requested today    > 20 Minutes was dedicated to this patient's encounter   Electronically signed by: Felix Pacini, DO [facility] [place]

## 2021-06-30 LAB — HM PAP SMEAR

## 2021-09-06 ENCOUNTER — Telehealth: Payer: Self-pay | Admitting: Family Medicine

## 2021-09-06 NOTE — Telephone Encounter (Signed)
Spoke with pt and informed her we will have to wait until appt to refill med since it is controlled.    Please add pt on waitlist

## 2021-09-06 NOTE — Telephone Encounter (Signed)
Pt called because she is out of her adderal and she scheduled an appt at the next available time which is next Wednesday 09/15/21. She wanted to know if something can be filled before then. Please advise

## 2021-09-06 NOTE — Telephone Encounter (Signed)
Done

## 2021-09-15 ENCOUNTER — Encounter: Payer: Self-pay | Admitting: Family Medicine

## 2021-09-15 ENCOUNTER — Other Ambulatory Visit: Payer: Self-pay

## 2021-09-15 ENCOUNTER — Telehealth (INDEPENDENT_AMBULATORY_CARE_PROVIDER_SITE_OTHER): Payer: Commercial Managed Care - PPO | Admitting: Family Medicine

## 2021-09-15 VITALS — Ht 63.5 in | Wt 185.0 lb

## 2021-09-15 DIAGNOSIS — F988 Other specified behavioral and emotional disorders with onset usually occurring in childhood and adolescence: Secondary | ICD-10-CM | POA: Diagnosis not present

## 2021-09-15 DIAGNOSIS — Z79899 Other long term (current) drug therapy: Secondary | ICD-10-CM | POA: Diagnosis not present

## 2021-09-15 MED ORDER — AMPHETAMINE-DEXTROAMPHET ER 20 MG PO CP24
20.0000 mg | ORAL_CAPSULE | Freq: Every day | ORAL | 0 refills | Status: DC
Start: 2021-09-15 — End: 2021-12-14

## 2021-09-15 NOTE — Progress Notes (Signed)
VIRTUAL VISIT VIA VIDEO  I connected with April Bernard on 09/15/21 at  2:30 PM EST by a video enabled telemedicine application and verified that I am speaking with the correct person using two identifiers. Location patient: Home Location provider: [facility], Office Persons participating in the virtual visit: Patient, Dr. Claiborne Billings and Les Pou, CMA  I discussed the limitations of evaluation and management by telemedicine and the availability of in person appointments. The patient expressed understanding and agreed to proceed.     Patient ID: April Bernard, female  DOB: 06/21/1986, 35 y.o.   MRN: [id number] Patient Care Team    Relationship Specialty Notifications Start End  Natalia Leatherwood, DO PCP - General Family Medicine  06/06/18   Olivia Mackie, MD Consulting Physician Obstetrics and Gynecology  05/15/20     Chief Complaint  Patient presents with   ADHD    [facility]    Subjective: April Bernard is a 35 y.o.  Female  present for [facility] ADD/controlled substance agreement in place:  Pt reports she was diagnosed with ADD in middle school. She has been on Adderall off and on over the years, with discontinuation in high school, when she lost insurance  She was established with a psychiatrist in 2013 that restarted the medication for her.  Patient discontinued her Adderall upon pregnancy.  She is no longer pregnant or breast-feeding and would like to restart her Adderall ER 20 mg daily. She states her ADD is well controlled on current dose and she denies negative SE. Indication: ADD Medication and dose: Adderall XR 20 mg QD # pills per month: 30 Last UDS date: next visit contract signed (Y/N): Y Date narcotic database last reviewed (include red flags):09/15/21   Depression screen [facility] 2/9 09/15/2021 03/04/2021 05/20/2019 11/28/2018 09/18/2018  Decreased Interest 0 0 0 0 0  Down, Depressed, Hopeless 0 0 0 0 0  PHQ - 2 Score 0 0 0 0 0  Altered sleeping - - 0 - -  Tired,  decreased energy - - 0 - -  Change in appetite - - 0 - -  Feeling bad or failure about yourself  - - 0 - -  Trouble concentrating - - 0 - -  Moving slowly or fidgety/restless - - 0 - -  Suicidal thoughts - - 0 - -  PHQ-9 Score - - 0 - -   GAD 7 : Generalized Anxiety Score 05/20/2019  Nervous, Anxious, on Edge 0  Control/stop worrying 0  Worry too much - different things 0  Trouble relaxing 0  Restless 0  Easily annoyed or irritable 0  Afraid - awful might happen 0  Total GAD 7 Score 0   Immunization History  Administered Date(s) Administered   Influenza,inj,Quad PF,6+ Mos 09/19/2017, 09/01/2018   Influenza-Unspecified 08/24/2015, 08/23/2020   PFIZER(Purple Top)SARS-COV-2 Vaccination 01/05/2020, 02/05/2020, 08/23/2020   Tdap 05/08/2014, 12/02/2019   Past Medical History:  Diagnosis Date   Acute cholecystitis 07/27/2020   ADD (attention deficit disorder)    Allergic rhinitis    Chicken pox    Cholelithiases 06/26/2020   Depression    Ganglion cyst of right foot    History of pregnancy induced hypertension    Preeclampsia    2018   Vaginal Pap smear, abnormal    No Known Allergies Past Surgical History:  Procedure Laterality Date   CESAREAN SECTION  11/08/2016   CESAREAN SECTION N/A 03/04/2020   Procedure: Repeat CESAREAN SECTION;  Surgeon: Olivia Mackie, MD;  [facility];  Service: Obstetrics;  Laterality: N/A;  EDD: 03/10/20   CHOLECYSTECTOMY N/A 07/27/2020   Procedure: LAPAROSCOPIC CHOLECYSTECTOMY WITH POSSIBLE  INTRAOPERATIVE CHOLANGIOGRAM;  Surgeon: Luretha Murphy, MD;  Location: [facility] ORS;  Service: General;  Laterality: N/A;   REFRACTIVE SURGERY Bilateral 2011   Family History  Problem Relation Age of Onset   Arthritis Mother    Hypertension Mother    Hyperlipidemia Father    Arthritis Maternal Grandmother    Hypertension Maternal Grandmother    Hypertension Maternal Grandfather    Heart disease Maternal Grandfather    Diabetes Maternal Grandfather     Hypertension Paternal Grandmother    Hyperlipidemia Paternal Grandmother    Breast cancer Paternal Grandmother    Hypertension Paternal Grandfather    Hyperlipidemia Paternal Grandfather    Prostate cancer Paternal Grandfather    Social History   Social History Narrative   Marital status/children/pets: married, 1 child.    Education/employment: B.S. Degree, works in [employer].   Safety:      -Wears a bicycle helmet riding a bike: No     -smoke alarm in the home:Yes     - wears seatbelt: Yes     - Feels safe in their relationships: Yes    Allergies as of 09/15/2021   No Known Allergies      Medication List        Accurate as of September 15, 2021  2:50 PM. If you have any questions, ask your nurse or doctor.          STOP taking these medications    prenatal multivitamin Tabs tablet Stopped by: Felix Pacini, DO       TAKE these medications    amphetamine-dextroamphetamine 20 MG 24 hr capsule Commonly known as: Adderall XR Take 1 capsule (20 mg total) by mouth daily.   amphetamine-dextroamphetamine 20 MG 24 hr capsule Commonly known as: Adderall XR Take 1 capsule (20 mg total) by mouth daily.   amphetamine-dextroamphetamine 20 MG 24 hr capsule Commonly known as: Adderall XR Take 1 capsule (20 mg total) by mouth daily.   Lo Loestrin Fe 1 MG-10 MCG / 10 MCG tablet Generic drug: Norethindrone-Ethinyl Estradiol-Fe Biphas Take 1 tablet by mouth daily.        All past medical history, surgical history, allergies, family history, immunizations andmedications were updated in the EMR today and reviewed under the history and medication portions of their EMR.     No results found for this or any previous visit (from the past 2160 hour(s)).   ROS: 14 pt review of systems performed and negative (unless mentioned in an HPI)  Objective: Ht 5' 3.5" (1.613 m)   Wt 185 lb (83.9 kg)   BMI 32.26 kg/m  Gen: Afebrile. No acute distress.  HENT: AT. [place].   Eyes:Pupils Equal Round Reactive to light, Extraocular movements intact,  Conjunctiva without redness, discharge or icterus..  Neuro: Alert. Oriented x3 Psych: Normal affect, dress and demeanor. Normal speech. Normal thought content and judgment.    No results found.  Assessment/plan: April Bernard is a 35 y.o. female present for [facility] Attention deficit disorder (ADD) without hyperactivity/Controlled substance agreement signed Stable.  Insurance required 30 d scripts only.   Indication: ADD Medication and dose: Adderall XR 20 mg QD # pills per month: 30 Last UDS date: next appointment contract signed (Y/N): y- Date narcotic database last reviewed (include red flags):09/15/21 Refills provided today for 3 mos - follow up 3 mos  Return in about  3 months (around 12/02/2021) for [facility] (30 min) in person.   No orders of the defined types were placed in this encounter.  Meds ordered this encounter  Medications   amphetamine-dextroamphetamine (ADDERALL XR) 20 MG 24 hr capsule    Sig: Take 1 capsule (20 mg total) by mouth daily.    Dispense:  30 capsule    Refill:  0   amphetamine-dextroamphetamine (ADDERALL XR) 20 MG 24 hr capsule    Sig: Take 1 capsule (20 mg total) by mouth daily.    Dispense:  30 capsule    Refill:  0    May refill approximately 60 days after prescribe date   amphetamine-dextroamphetamine (ADDERALL XR) 20 MG 24 hr capsule    Sig: Take 1 capsule (20 mg total) by mouth daily.    Dispense:  30 capsule    Refill:  0    May refill approximately 30 days after prescribe date     Referral Orders  No referral(s) requested today    > 20 Minutes was dedicated to this patient's encounter   Electronically signed by: Felix Pacini, DO [facility] [place]

## 2021-12-06 ENCOUNTER — Telehealth: Payer: Commercial Managed Care - PPO | Admitting: Family Medicine

## 2021-12-13 ENCOUNTER — Other Ambulatory Visit: Payer: Self-pay

## 2021-12-14 ENCOUNTER — Ambulatory Visit: Payer: Commercial Managed Care - PPO | Admitting: Family Medicine

## 2021-12-14 ENCOUNTER — Encounter: Payer: Self-pay | Admitting: Family Medicine

## 2021-12-14 VITALS — BP 131/85 | HR 99 | Temp 97.6°F | Ht 63.5 in | Wt 170.0 lb

## 2021-12-14 DIAGNOSIS — F988 Other specified behavioral and emotional disorders with onset usually occurring in childhood and adolescence: Secondary | ICD-10-CM | POA: Diagnosis not present

## 2021-12-14 DIAGNOSIS — Z79899 Other long term (current) drug therapy: Secondary | ICD-10-CM

## 2021-12-14 MED ORDER — AMPHETAMINE-DEXTROAMPHET ER 20 MG PO CP24: 20.0000 mg | ORAL_CAPSULE | Freq: Every day | ORAL | 0 refills | Status: DC

## 2021-12-14 NOTE — Progress Notes (Signed)
Patient ID: [name], female  DOB: 08/26/1986, 36 y.o.   MRN: [id number] Patient Care Team    Relationship Specialty Notifications Start End  Natalia Leatherwood, DO PCP - General Family Medicine  06/06/18   Olivia Mackie, MD Consulting Physician Obstetrics and Gynecology  05/15/20     Chief Complaint  Patient presents with   ADHD    [facility]; pt is not fasting    Subjective: April Bernard is a 36 y.o.  Female  present for [facility] ADD/controlled substance agreement in place:  Pt reports she was diagnosed with ADD in middle school. She has been on Adderall off and on over the years, with discontinuation in high school, when she lost insurance  She was established with a psychiatrist in 2013 that restarted the medication for her.  Patient discontinued her Adderall upon pregnancy.  She is no longer pregnant or breast-feeding and would like to restart her Adderall ER 20 mg daily. She states her ADD is well controlled  on current dose and she denies negative SE. Indication: ADD Medication and dose: Adderall XR 20 mg QD # pills per month: 30 Last UDS date: UTD contract signed (Y/N): Y Date narcotic database last reviewed (include red flags):12/14/21   Depression screen [facility] 2/9 09/15/2021 03/04/2021 05/20/2019 11/28/2018 09/18/2018  Decreased Interest 0 0 0 0 0  Down, Depressed, Hopeless 0 0 0 0 0  PHQ - 2 Score 0 0 0 0 0  Altered sleeping - - 0 - -  Tired, decreased energy - - 0 - -  Change in appetite - - 0 - -  Feeling bad or failure about yourself  - - 0 - -  Trouble concentrating - - 0 - -  Moving slowly or fidgety/restless - - 0 - -  Suicidal thoughts - - 0 - -  PHQ-9 Score - - 0 - -   GAD 7 : Generalized Anxiety Score 05/20/2019  Nervous, Anxious, on Edge 0  Control/stop worrying 0  Worry too much - different things 0  Trouble relaxing 0  Restless 0  Easily annoyed or irritable 0  Afraid - awful might happen 0  Total GAD 7 Score 0   Immunization History  Administered  Date(s) Administered   Influenza,inj,Quad PF,6+ Mos 09/19/2017, 09/01/2018   Influenza-Unspecified 08/24/2015, 08/23/2020   PFIZER(Purple Top)SARS-COV-2 Vaccination 01/05/2020, 02/05/2020, 08/23/2020   Tdap 05/08/2014, 12/02/2019   Past Medical History:  Diagnosis Date   Acute cholecystitis 07/27/2020   ADD (attention deficit disorder)    Allergic rhinitis    Chicken pox    Cholelithiases 06/26/2020   Depression    Ganglion cyst of right foot    History of pregnancy induced hypertension    Preeclampsia    2018   Vaginal Pap smear, abnormal    No Known Allergies Past Surgical History:  Procedure Laterality Date   CESAREAN SECTION  11/08/2016   CESAREAN SECTION N/A 03/04/2020   Procedure: Repeat CESAREAN SECTION;  Surgeon: Olivia Mackie, MD;  [facility]: [facility];  Service: Obstetrics;  Laterality: N/A;  EDD: 03/10/20   CHOLECYSTECTOMY N/A 07/27/2020   Procedure: LAPAROSCOPIC CHOLECYSTECTOMY WITH POSSIBLE  INTRAOPERATIVE CHOLANGIOGRAM;  Surgeon: Luretha Murphy, MD;  Location: [facility] ORS;  Service: General;  Laterality: N/A;   REFRACTIVE SURGERY Bilateral 2011   Family History  Problem Relation Age of Onset   Arthritis Mother    Hypertension Mother    Hyperlipidemia Father    Arthritis Maternal Grandmother    Hypertension Maternal Grandmother  Hypertension Maternal Grandfather    Heart disease Maternal Grandfather    Diabetes Maternal Grandfather    Hypertension Paternal Grandmother    Hyperlipidemia Paternal Grandmother    Breast cancer Paternal Grandmother    Hypertension Paternal Grandfather    Hyperlipidemia Paternal Grandfather    Prostate cancer Paternal Grandfather    Social History   Social History Narrative   Marital status/children/pets: married, 1 child.    Education/employment: B.S. Degree, works in [employer].   Safety:      -Wears a bicycle helmet riding a bike: No     -smoke alarm in the home:Yes     - wears seatbelt: Yes     - Feels safe in their  relationships: Yes    Allergies as of 12/14/2021   No Known Allergies      Medication List        Accurate as of December 14, 2021  3:21 PM. If you have any questions, ask your nurse or doctor.          STOP taking these medications    Lo Loestrin Fe 1 MG-10 MCG / 10 MCG tablet Generic drug: Norethindrone-Ethinyl Estradiol-Fe Biphas Stopped by: Felix Pacini, DO       TAKE these medications    amphetamine-dextroamphetamine 20 MG 24 hr capsule Commonly known as: Adderall XR Take 1 capsule (20 mg total) by mouth daily.   amphetamine-dextroamphetamine 20 MG 24 hr capsule Commonly known as: Adderall XR Take 1 capsule (20 mg total) by mouth daily.   amphetamine-dextroamphetamine 20 MG 24 hr capsule Commonly known as: Adderall XR Take 1 capsule (20 mg total) by mouth daily.        All past medical history, surgical history, allergies, family history, immunizations andmedications were updated in the EMR today and reviewed under the history and medication portions of their EMR.     No results found for this or any previous visit (from the past 2160 hour(s)).   ROS: 14 pt review of systems performed and negative (unless mentioned in an HPI)  Objective: BP 131/85    Pulse 99    Temp 97.6 F (36.4 C) (Oral)    Ht 5' 3.5" (1.613 m)    Wt 170 lb (77.1 kg)    SpO2 100%    BMI 29.64 kg/m  Physical Exam Vitals and nursing note reviewed.  Constitutional:      General: She is not in acute distress.    Appearance: Normal appearance. She is normal weight. She is not ill-appearing or toxic-appearing.  Eyes:     Extraocular Movements: Extraocular movements intact.     Conjunctiva/sclera: Conjunctivae normal.     Pupils: Pupils are equal, round, and reactive to light.  Cardiovascular:     Rate and Rhythm: Normal rate and regular rhythm.  Neurological:     Mental Status: She is alert and oriented to person, place, and time. Mental status is at baseline.  Psychiatric:         Mood and Affect: Mood normal.        Behavior: Behavior normal.        Thought Content: Thought content normal.        Judgment: Judgment normal.   No results found.  Assessment/plan: Yarnell Daugherty is a 36 y.o. female present for [facility] Attention deficit disorder (ADD) without hyperactivity/Controlled substance agreement signed Stable.  If Product backorder at her pharmacy will provide Concerta 54 Insurance required 30 d scripts only.   Indication: ADD Medication and  dose: Adderall XR 20 mg QD # pills per month: 30 Last UDS date: next appointment contract signed (Y/N): y- Date narcotic database last reviewed (include red flags):12/14/21 Refills provided today for 3 mos - follow up 3 mos  Return in about 3 months (around 03/07/2022) for CPE (30 min), [facility] (30 min).   No orders of the defined types were placed in this encounter.   Meds ordered this encounter  Medications   amphetamine-dextroamphetamine (ADDERALL XR) 20 MG 24 hr capsule    Sig: Take 1 capsule (20 mg total) by mouth daily.    Dispense:  30 capsule    Refill:  0   amphetamine-dextroamphetamine (ADDERALL XR) 20 MG 24 hr capsule    Sig: Take 1 capsule (20 mg total) by mouth daily.    Dispense:  30 capsule    Refill:  0    May refill approximately 60 days after prescribe date   amphetamine-dextroamphetamine (ADDERALL XR) 20 MG 24 hr capsule    Sig: Take 1 capsule (20 mg total) by mouth daily.    Dispense:  30 capsule    Refill:  0    May refill approximately 30 days after prescribe date     Referral Orders  No referral(s) requested today      Electronically signed by: Felix Pacini, DO [facility] [place]

## 2021-12-17 ENCOUNTER — Telehealth: Payer: Self-pay

## 2021-12-17 MED ORDER — METHYLPHENIDATE HCL ER (OSM) 54 MG PO TBCR: 54.0000 mg | EXTENDED_RELEASE_TABLET | ORAL | 0 refills | Status: DC

## 2021-12-17 NOTE — Telephone Encounter (Signed)
Please advise.  Note:  If Product backorder at her pharmacy will provide Concerta 54 Insurance required 30 d scripts only.

## 2021-12-17 NOTE — Telephone Encounter (Signed)
Pharmacy received one script but not the second. Please advise on second Concerta script

## 2021-12-17 NOTE — Telephone Encounter (Signed)
Patient calling regarding her Adderall prescription -- out of stock.  Patient states that Dr. Claiborne Billings was going to change to Concerta, if patient found in stock.  Patient can be reached [phone]  [facility] [address]

## 2021-12-17 NOTE — Telephone Encounter (Signed)
Spoke with pt regarding medication and instructions.  

## 2021-12-17 NOTE — Telephone Encounter (Signed)
Concerta prescribed x2 mos. Hopefully adderall will be back in stock by then and she has scripts for that

## 2021-12-17 NOTE — Addendum Note (Signed)
Addended by: Howard Pouch A on: 12/17/2021 04:32 PM   Modules accepted: Orders

## 2021-12-24 IMAGING — US US ABDOMEN LIMITED
1 series · 13 of 25 positions shown · non-contrast
Comparison: None.

CLINICAL DATA: Right upper quadrant pain

EXAM:
ULTRASOUND ABDOMEN LIMITED RIGHT UPPER QUADRANT

[Series 1: us abdomen limited · 13 of 43 slices shown]
[im 1/43]
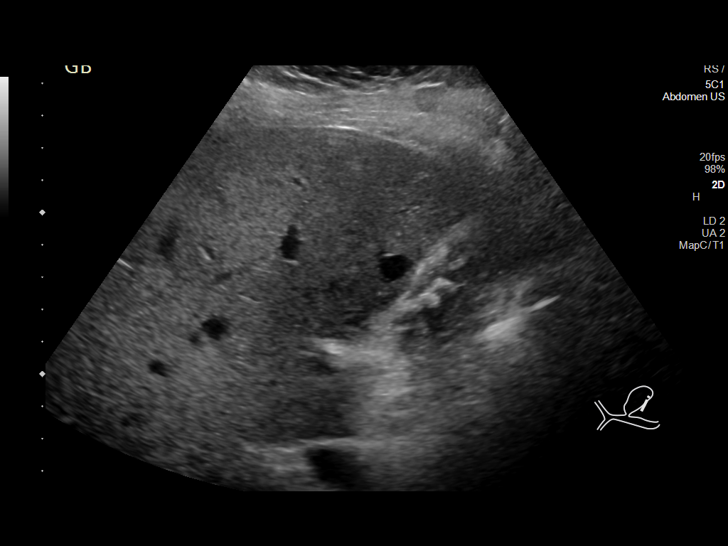
[im 4/43]
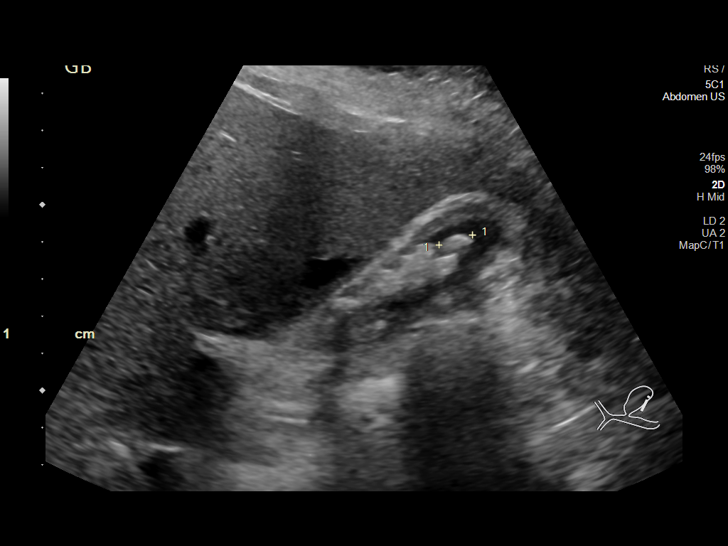
[im 8/43]
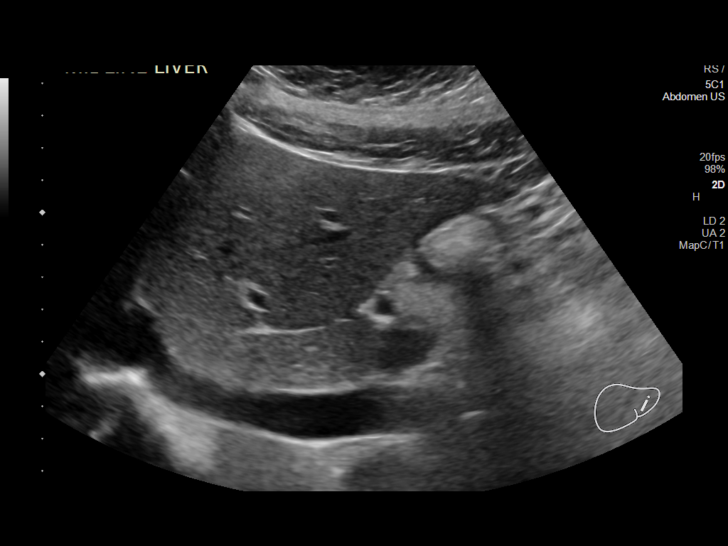
[im 11/43]
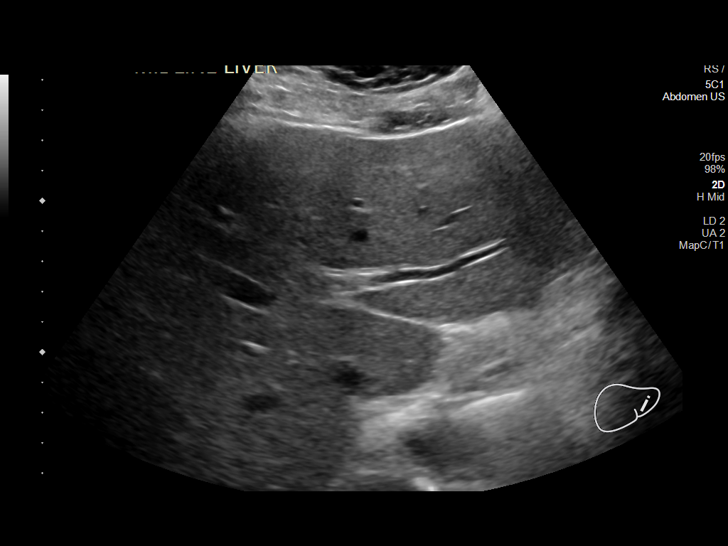
[im 15/43]
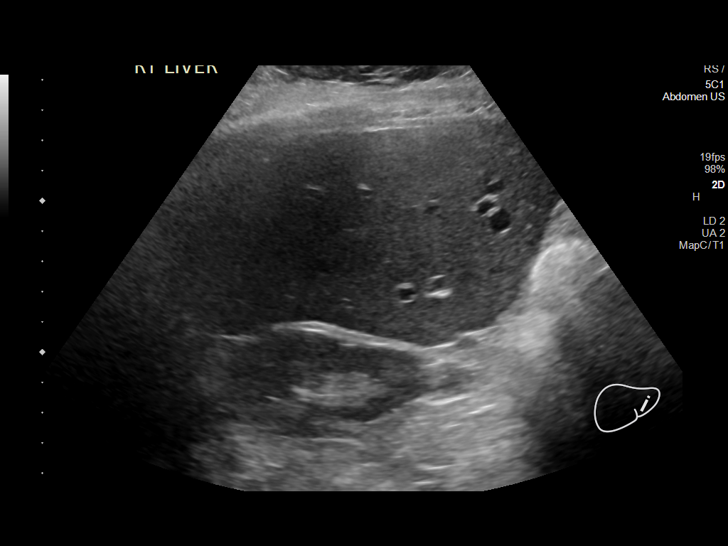
[im 18/43]
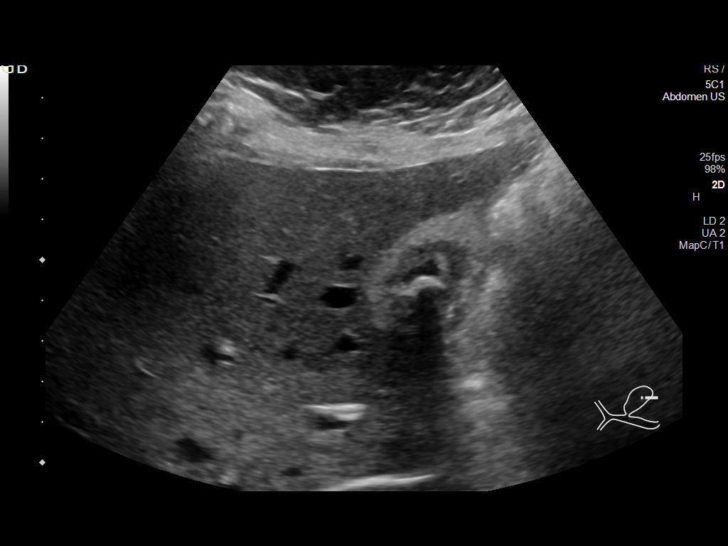
[im 22/43]
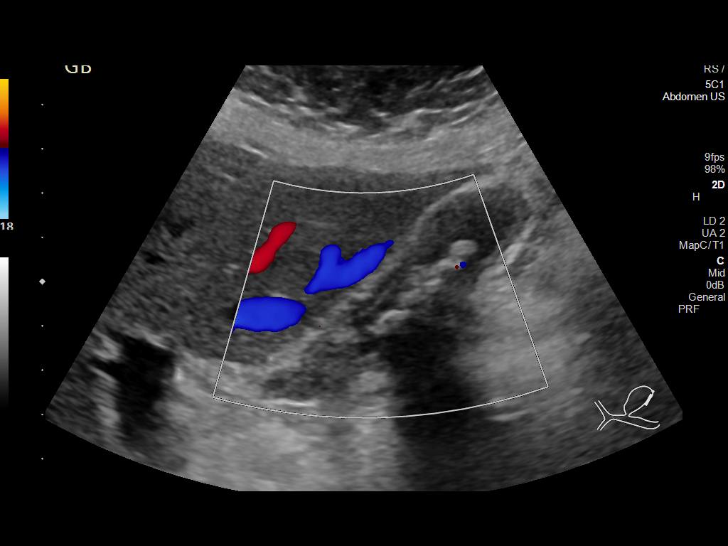
[im 25/43]
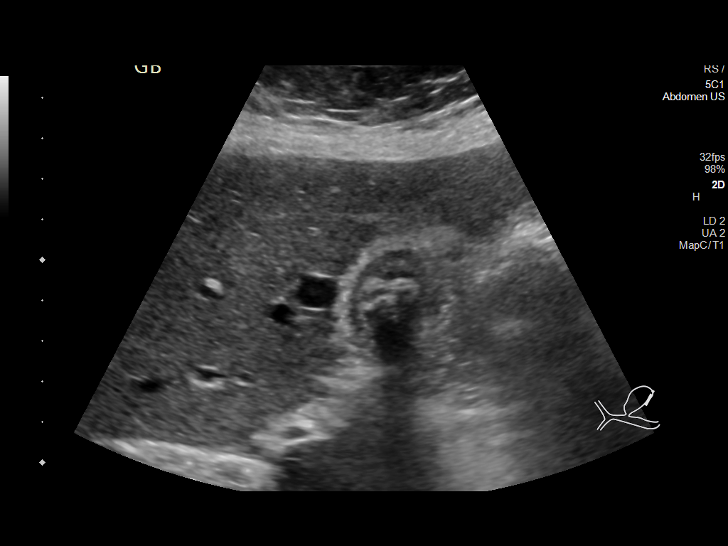
[im 29/43]
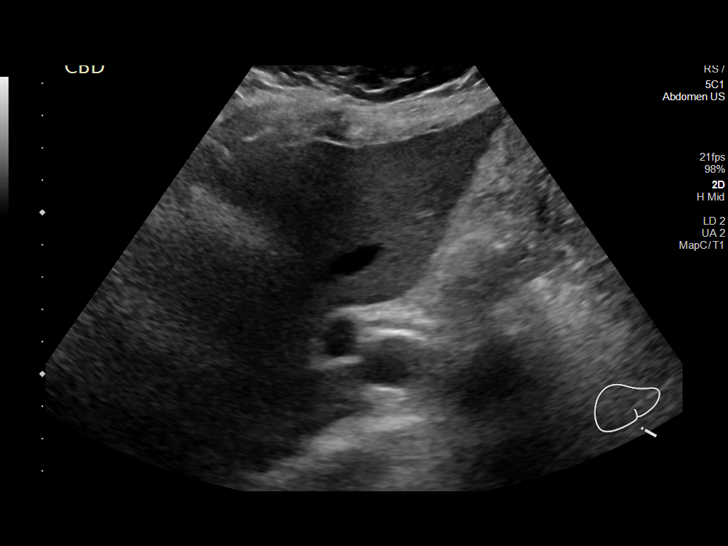
[im 32/43]
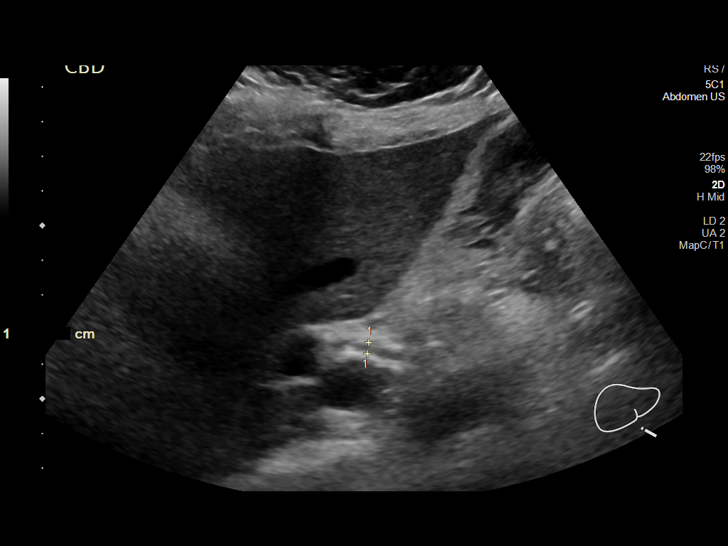
[im 36/43]
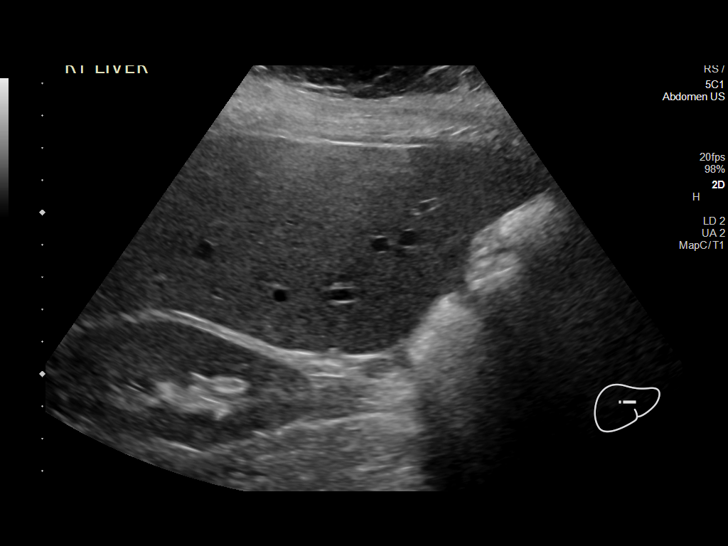
[im 39/43]
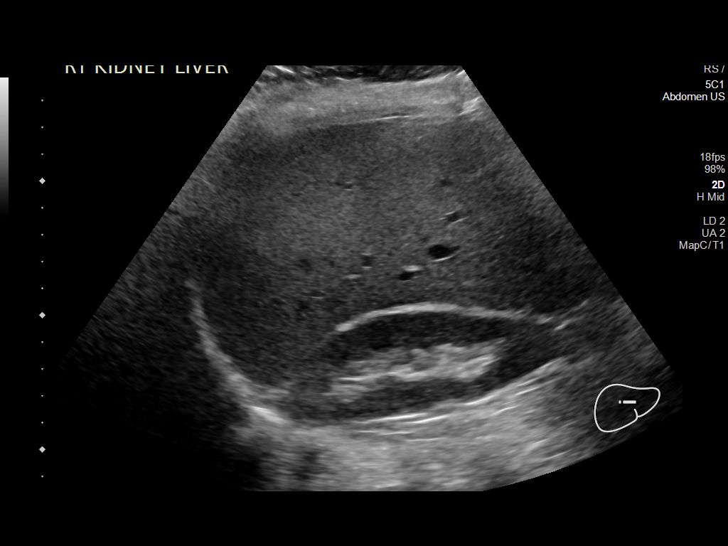
[im 43/43]
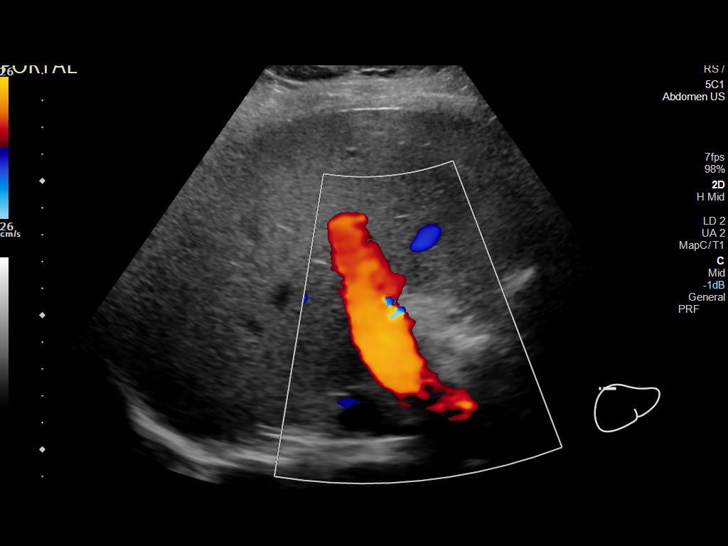

[13 of 25 positions shown; findings below may reference images not displayed]

FINDINGS: Gallbladder:

There are numerous hyperdense shadowing stones within the
gallbladder lumen, largest discrete stone measures up to 9 mm in
diameter. Gallbladder is partially contracted with gallbladder wall
thickening of 7 mm. No pericholecystic fluid or edema. No
sonographic Murphy sign noted by sonographer.

Common bile duct:

Diameter: 3 mm.

Liver:

No focal lesion identified. Mildly increased hepatic parenchymal
echogenicity. Portal vein is patent on color Doppler imaging with
normal direction of blood flow towards the liver.

Other: None.
IMPRESSION: 1. Cholelithiasis.
2. Gallbladder wall is diffusely thickened measuring 7 mm. These
findings may be exaggerated by a partially contracted gallbladder.
Patient was not NPO for the exam. There is no pericholecystic edema
and no sonographic Murphy sign. If there is high clinical suspicion
for acute cholecystitis, a nuclear medicine hepatobiliary scan could
be performed to assess the patency of the cystic duct and common
bile duct.
3. The echogenicity of the liver is increased. This is a nonspecific
finding but is most commonly seen with fatty infiltration of the
liver. There are no obvious focal liver lesions.

## 2022-03-07 ENCOUNTER — Encounter: Payer: Self-pay | Admitting: Family Medicine

## 2022-03-07 ENCOUNTER — Ambulatory Visit (INDEPENDENT_AMBULATORY_CARE_PROVIDER_SITE_OTHER): Payer: Commercial Managed Care - PPO | Admitting: Family Medicine

## 2022-03-07 VITALS — BP 105/72 | HR 83 | Temp 98.2°F | Ht 64.25 in | Wt 165.0 lb

## 2022-03-07 DIAGNOSIS — E559 Vitamin D deficiency, unspecified: Secondary | ICD-10-CM

## 2022-03-07 DIAGNOSIS — F988 Other specified behavioral and emotional disorders with onset usually occurring in childhood and adolescence: Secondary | ICD-10-CM

## 2022-03-07 DIAGNOSIS — Z0000 Encounter for general adult medical examination without abnormal findings: Secondary | ICD-10-CM

## 2022-03-07 DIAGNOSIS — E669 Obesity, unspecified: Secondary | ICD-10-CM | POA: Diagnosis not present

## 2022-03-07 DIAGNOSIS — E785 Hyperlipidemia, unspecified: Secondary | ICD-10-CM | POA: Diagnosis not present

## 2022-03-07 LAB — COMPREHENSIVE METABOLIC PANEL
ALT: 21 U/L (ref 0–35)
AST: 16 U/L (ref 0–37)
Albumin: 4.4 g/dL (ref 3.5–5.2)
Alkaline Phosphatase: 80 U/L (ref 39–117)
BUN: 12 mg/dL (ref 6–23)
CO2: 25 mEq/L (ref 19–32)
Calcium: 9.1 mg/dL (ref 8.4–10.5)
Chloride: 105 mEq/L (ref 96–112)
Creatinine, Ser: 0.77 mg/dL (ref 0.40–1.20)
GFR: 98.69 mL/min (ref >60.00)
Glucose, Bld: 90 mg/dL (ref 70–99)
Potassium: 4.1 mEq/L (ref 3.5–5.1)
Sodium: 137 mEq/L (ref 135–145)
Total Bilirubin: 0.5 mg/dL (ref 0.2–1.2)
Total Protein: 7 g/dL (ref 6.0–8.3)

## 2022-03-07 LAB — LIPID PANEL
Cholesterol: 190 mg/dL (ref 0–200)
HDL: 70.3 mg/dL (ref >39.00)
LDL Cholesterol: 110 mg/dL — ABNORMAL HIGH (ref 0–99)
NonHDL: 119.32
Total CHOL/HDL Ratio: 3
Triglycerides: 46 mg/dL (ref 0.0–149.0)
VLDL: 9.2 mg/dL (ref 0.0–40.0)

## 2022-03-07 LAB — CBC
HCT: 38.6 % (ref 36.0–46.0)
Hemoglobin: 12.5 g/dL (ref 12.0–15.0)
MCHC: 32.4 g/dL (ref 30.0–36.0)
MCV: 88.7 fl (ref 78.0–100.0)
Platelets: 298 10*3/uL (ref 150.0–400.0)
RBC: 4.35 Mil/uL (ref 3.87–5.11)
RDW: 14.6 % (ref 11.5–15.5)
WBC: 7.2 10*3/uL (ref 4.0–10.5)

## 2022-03-07 LAB — HEMOGLOBIN A1C: Hgb A1c MFr Bld: 5.3 % (ref 4.6–6.5)

## 2022-03-07 MED ORDER — AMPHETAMINE-DEXTROAMPHETAMINE 20 MG PO TABS: 20.0000 mg | ORAL_TABLET | Freq: Every day | ORAL | 0 refills | Status: DC

## 2022-03-07 MED ORDER — AMPHETAMINE-DEXTROAMPHET ER 20 MG PO CP24: 20.0000 mg | ORAL_CAPSULE | Freq: Every day | ORAL | 0 refills | Status: DC

## 2022-03-07 NOTE — Progress Notes (Signed)
? ? ?Patient ID: April BlacksmithLeanne Meadow, female  DOB: 12-28-1985, 36 y.o.   MRN: [id number] ?Patient Care Team  ?  Relationship Specialty Notifications Start End  ?Natalia LeatherwoodKuneff, Nature Vogelsang A, DO PCP - General Family Medicine  06/06/18   ?Olivia Mackieaavon, Richard, MD Consulting Physician Obstetrics and Gynecology  05/15/20   ? ? ?Chief Complaint  ?Patient presents with  ? Annual Exam  ?  Pt is fasting  ? ? ?Subjective: ?April BlacksmithLeanne Crotteau is a 36 y.o.  Female  present for CPE/CMC ?All past medical history, surgical history, allergies, family history, immunizations, medications and social history were updated in the electronic medical record today. ?All recent labs, ED visits and hospitalizations within the last year were reviewed. ? ?Health maintenance:  ?Colonoscopy: no fhx- routine screen 45. ?Mammogram: FHx, PGM. Routine screen 40  ?Cervical cancer screening: last pap: 2021, completed by: (Dr. Rosemary Holmsavon) ?Immunizations: tdap 2021 UTD, Influenza (encouraged yearly), covid x3 ?Infectious disease screening: HIV  and hep c completed ?DEXA: routine screen ?Assistive device: none ?Oxygen XLK:GMWNuse:none ?Patient has a Dental home. ?Hospitalizations/ED visits: reviewed ? ?ADD/controlled substance agreement in place:  ?Pt reports she was diagnosed with ADD in middle school. She has been on Adderall off and on over the years, with discontinuation in high school, when she lost insurance  She was established with a psychiatrist in 2013 that restarted the medication for her.  ?Patient discontinued her Adderall upon pregnancy.  She is no longer pregnant or breast-feeding and would like to restart her Adderall ER 20 mg daily. She states her ADD is well controlled  on current dose and she denies negative SE.She has had to switch to concerta d/t factory backorder of adderall last 2 months. She reports today she much prefers the Adderall or Concerta. ?She picked up concerta #30 02/15/2022 and the adderall 02/16/2022 since it was back in stock, she was able to use one of her  adderall scripts.  ?Indication: ADD ?Medication and dose: Adderall XR 20 mg QD ?# pills per month: 30 ?Last UDS date: UTD ?contract signed (Y/N): Y ?Date narcotic database last reviewed (include red flags): 03/07/22 ? ? ? ?  03/07/2022  ? 10:02 AM 09/15/2021  ?  2:26 PM 03/04/2021  ? 10:58 AM 05/20/2019  ? 11:09 AM 11/28/2018  ?  8:08 AM  ?Depression screen PHQ 2/9  ?Decreased Interest 0 0 0 0 0  ?Down, Depressed, Hopeless 0 0 0 0 0  ?PHQ - 2 Score 0 0 0 0 0  ?Altered sleeping    0   ?Tired, decreased energy    0   ?Change in appetite    0   ?Feeling bad or failure about yourself     0   ?Trouble concentrating    0   ?Moving slowly or fidgety/restless    0   ?Suicidal thoughts    0   ?PHQ-9 Score    0   ? ? ?Immunization History  ?Administered Date(s) Administered  ? Influenza,inj,Quad PF,6+ Mos 09/19/2017, 09/01/2018  ? Influenza-Unspecified 08/24/2015, 08/23/2020  ? PFIZER(Purple Top)SARS-COV-2 Vaccination 01/05/2020, 02/05/2020, 08/23/2020  ? Tdap 05/08/2014, 12/02/2019  ? ?Past Medical History:  ?Diagnosis Date  ? Acute cholecystitis 07/27/2020  ? ADD (attention deficit disorder)   ? Allergic rhinitis   ? Chicken pox   ? Cholelithiases 06/26/2020  ? Depression   ? Ganglion cyst of right foot   ? History of pregnancy induced hypertension   ? Preeclampsia   ? 2018  ? Vaginal Pap smear, abnormal   ? ?  No Known Allergies ?Past Surgical History:  ?Procedure Laterality Date  ? CESAREAN SECTION  11/08/2016  ? CESAREAN SECTION N/A 03/04/2020  ? Procedure: Repeat CESAREAN SECTION;  Surgeon: Olivia Mackie, MD;  Location: [facility];  Service: Obstetrics;  Laterality: N/A;  EDD: 03/10/20  ? CHOLECYSTECTOMY N/A 07/27/2020  ? Procedure: LAPAROSCOPIC CHOLECYSTECTOMY WITH POSSIBLE  INTRAOPERATIVE CHOLANGIOGRAM;  Surgeon: Luretha Murphy, MD;  Location: [facility];  Service: General;  Laterality: N/A;  ? REFRACTIVE SURGERY Bilateral 2011  ? ?Family History  ?Problem Relation Age of Onset  ? Arthritis Mother   ? Hypertension Mother   ?  Hyperlipidemia Father   ? Arthritis Maternal Grandmother   ? Hypertension Maternal Grandmother   ? Hypertension Maternal Grandfather   ? Heart disease Maternal Grandfather   ? Diabetes Maternal Grandfather   ? Hypertension Paternal Grandmother   ? Hyperlipidemia Paternal Grandmother   ? Breast cancer Paternal Grandmother   ? Hypertension Paternal Grandfather   ? Hyperlipidemia Paternal Grandfather   ? Prostate cancer Paternal Grandfather   ? ?Social History  ? ?Social History Narrative  ? Marital status/children/pets: married, 1 child.   ? Education/employment: B.S. Degree, works in [employer].  ? Safety:   ?   -Wears a bicycle helmet riding a bike: No  ?   -smoke alarm in the home:Yes  ?   - wears seatbelt: Yes  ?   - Feels safe in their relationships: Yes  ? ? ?Allergies as of 03/07/2022   ?No Known Allergies ?  ? ?  ?Medication List  ?  ? ?  ? Accurate as of Mar 07, 2022 11:30 AM. If you have any questions, ask your nurse or doctor.  ?  ?  ? ?  ? ?STOP taking these medications   ? ?methylphenidate 54 MG CR tablet ?Commonly known as: Concerta ?Stopped by: Felix Pacini, DO ?  ? ?  ? ?TAKE these medications   ? ?amphetamine-dextroamphetamine 20 MG 24 hr capsule ?Commonly known as: Adderall XR ?Take 1 capsule (20 mg total) by mouth daily. ?What changed: Another medication with the same name was added. Make sure you understand how and when to take each. ?Changed by: Felix Pacini, DO ?  ?amphetamine-dextroamphetamine 20 MG 24 hr capsule ?Commonly known as: Adderall XR ?Take 1 capsule (20 mg total) by mouth daily. ?What changed: Another medication with the same name was added. Make sure you understand how and when to take each. ?Changed by: Felix Pacini, DO ?  ?amphetamine-dextroamphetamine 20 MG tablet ?Commonly known as: ADDERALL ?Take 1 tablet (20 mg total) by mouth daily. ?What changed: You were already taking a medication with the same name, and this prescription was added. Make sure you understand how and when  to take each. ?Changed by: Felix Pacini, DO ?  ?buPROPion 150 MG 24 hr tablet ?Commonly known as: WELLBUTRIN XL ?Take 150 mg by mouth every morning. ?  ?fluticasone 50 MCG/ACT nasal spray ?Commonly known as: FLONASE ?Place into both nostrils daily. ?  ?loratadine 10 MG tablet ?Commonly known as: CLARITIN ?Take 10 mg by mouth daily. ?  ?PATADAY OP ?Apply to eye. ?  ? ?  ? ? ?All past medical history, surgical history, allergies, family history, immunizations andmedications were updated in the EMR today and reviewed under the history and medication portions of their EMR.    ? ?No results found for this or any previous visit (from the past 2160 hour(s)). ? ? ?ROS ?14 pt review of systems performed and  negative (unless mentioned in an HPI) ? ?Objective: ?BP 105/72   Pulse 83   Temp 98.2 ?F (36.8 ?C) (Oral)   Ht 5' 4.25" (1.632 m)   Wt 165 lb (74.8 kg)   LMP 02/14/2022 (Approximate)   SpO2 100%   BMI 28.10 kg/m?  ?Physical Exam ?Vitals and nursing note reviewed.  ?Constitutional:   ?   General: She is not in acute distress. ?   Appearance: Normal appearance. She is obese. She is not ill-appearing or toxic-appearing.  ?HENT:  ?   Head: Normocephalic and atraumatic.  ?   Right Ear: Tympanic membrane, ear canal and external ear normal. There is no impacted cerumen.  ?   Left Ear: Tympanic membrane, ear canal and external ear normal. There is no impacted cerumen.  ?   Nose: No congestion or rhinorrhea.  ?   Mouth/Throat:  ?   Mouth: Mucous membranes are moist.  ?   Pharynx: Oropharynx is clear. No oropharyngeal exudate or posterior oropharyngeal erythema.  ?Eyes:  ?   General: No scleral icterus.    ?   Right eye: No discharge.     ?   Left eye: No discharge.  ?   Extraocular Movements: Extraocular movements intact.  ?   Conjunctiva/sclera: Conjunctivae normal.  ?   Pupils: Pupils are equal, round, and reactive to light.  ?Cardiovascular:  ?   Rate and Rhythm: Normal rate and regular rhythm.  ?   Pulses: Normal  pulses.  ?   Heart sounds: Normal heart sounds. No murmur heard. ?  No friction rub. No gallop.  ?Pulmonary:  ?   Effort: Pulmonary effort is normal. No respiratory distress.  ?   Breath sounds: Normal breath sounds. No stri

## 2022-03-07 NOTE — Patient Instructions (Signed)
No follow-ups on file.        Great to see you today.  I have refilled the medication(s) we provide.   If labs were collected, we will inform you of lab results once received either by echart message or telephone call.   - echart message- for normal results that have been seen by the patient already.   - telephone call: abnormal results or if patient has not viewed results in their echart.  Health Maintenance, Female Adopting a healthy lifestyle and getting preventive care are important in promoting health and wellness. Ask your health care provider about: The right schedule for you to have regular tests and exams. Things you can do on your own to prevent diseases and keep yourself healthy. What should I know about diet, weight, and exercise? Eat a healthy diet  Eat a diet that includes plenty of vegetables, fruits, low-fat dairy products, and lean protein. Do not eat a lot of foods that are high in solid fats, added sugars, or sodium. Maintain a healthy weight Body mass index (BMI) is used to identify weight problems. It estimates body fat based on height and weight. Your health care provider can help determine your BMI and help you achieve or maintain a healthy weight. Get regular exercise Get regular exercise. This is one of the most important things you can do for your health. Most adults should: Exercise for at least 150 minutes each week. The exercise should increase your heart rate and make you sweat (moderate-intensity exercise). Do strengthening exercises at least twice a week. This is in addition to the moderate-intensity exercise. Spend less time sitting. Even light physical activity can be beneficial. Watch cholesterol and blood lipids Have your blood tested for lipids and cholesterol at 37 years of age, then have this test every 5 years. Have your cholesterol levels checked more often if: Your lipid or cholesterol levels are high. You are older than 37 years of  age. You are at high risk for heart disease. What should I know about cancer screening? Depending on your health history and family history, you may need to have cancer screening at various ages. This may include screening for: Breast cancer. Cervical cancer. Colorectal cancer. Skin cancer. Lung cancer. What should I know about heart disease, diabetes, and high blood pressure? Blood pressure and heart disease High blood pressure causes heart disease and increases the risk of stroke. This is more likely to develop in people who have high blood pressure readings or are overweight. Have your blood pressure checked: Every 3-5 years if you are 18-39 years of age. Every year if you are 40 years old or older. Diabetes Have regular diabetes screenings. This checks your fasting blood sugar level. Have the screening done: Once every three years after age 40 if you are at a normal weight and have a low risk for diabetes. More often and at a younger age if you are overweight or have a high risk for diabetes. What should I know about preventing infection? Hepatitis B If you have a higher risk for hepatitis B, you should be screened for this virus. Talk with your health care provider to find out if you are at risk for hepatitis B infection. Hepatitis C Testing is recommended for: Everyone born from 1945 through 1965. Anyone with known risk factors for hepatitis C. Sexually transmitted infections (STIs) Get screened for STIs, including gonorrhea and chlamydia, if: You are sexually active and are younger than 37 years of age. You are   older than 37 years of age and your health care provider tells you that you are at risk for this type of infection. Your sexual activity has changed since you were last screened, and you are at increased risk for chlamydia or gonorrhea. Ask your health care provider if you are at risk. Ask your health care provider about whether you are at high risk for HIV. Your health  care provider may recommend a prescription medicine to help prevent HIV infection. If you choose to take medicine to prevent HIV, you should first get tested for HIV. You should then be tested every 3 months for as long as you are taking the medicine. Pregnancy If you are about to stop having your period (premenopausal) and you may become pregnant, seek counseling before you get pregnant. Take 400 to 800 micrograms (mcg) of folic acid every day if you become pregnant. Ask for birth control (contraception) if you want to prevent pregnancy. Osteoporosis and menopause Osteoporosis is a disease in which the bones lose minerals and strength with aging. This can result in bone fractures. If you are 65 years old or older, or if you are at risk for osteoporosis and fractures, ask your health care provider if you should: Be screened for bone loss. Take a calcium or vitamin D supplement to lower your risk of fractures. Be given hormone replacement therapy (HRT) to treat symptoms of menopause. Follow these instructions at home: Alcohol use Do not drink alcohol if: Your health care provider tells you not to drink. You are pregnant, may be pregnant, or are planning to become pregnant. If you drink alcohol: Limit how much you have to: 0-1 drink a day. Know how much alcohol is in your drink. In the [place] one drink equals one 12 oz bottle of beer (355 mL), one 5 oz glass of wine (148 mL), or one 1 oz glass of hard liquor (44 mL). Lifestyle Do not use any products that contain nicotine or tobacco. These products include cigarettes, chewing tobacco, and vaping devices, such as e-cigarettes. If you need help quitting, ask your health care provider. Do not use street drugs. Do not share needles. Ask your health care provider for help if you need support or information about quitting drugs. General instructions Schedule regular health, dental, and eye exams. Stay current with your vaccines. Tell your health  care provider if: You often feel depressed. You have ever been abused or do not feel safe at home. Summary Adopting a healthy lifestyle and getting preventive care are important in promoting health and wellness. Follow your health care provider's instructions about healthy diet, exercising, and getting tested or screened for diseases. Follow your health care provider's instructions on monitoring your cholesterol and blood pressure. This information is not intended to replace advice given to you by your health care provider. Make sure you discuss any questions you have with your health care provider. Document Revised: 03/08/2021 Document Reviewed: 03/08/2021 Elsevier Patient Education  2023 [employer]  

## 2022-03-08 LAB — VITAMIN D 25 HYDROXY (VIT D DEFICIENCY, FRACTURES): VITD: 21.47 ng/mL — ABNORMAL LOW (ref 30.00–100.00)

## 2022-03-08 LAB — TSH: TSH: 0.94 u[IU]/mL (ref 0.35–5.50)

## 2022-03-09 ENCOUNTER — Telehealth: Payer: Self-pay

## 2022-03-09 MED ORDER — AMPHETAMINE-DEXTROAMPHET ER 20 MG PO CP24: 20.0000 mg | ORAL_CAPSULE | Freq: Every day | ORAL | 0 refills | Status: DC

## 2022-03-09 NOTE — Telephone Encounter (Signed)
It does appear one of the 3 Adderall prescriptions sent to her pharmacy was the short acting. ?Unfortunately, I cannot change that particular prescription.  However I did send in another Adderall XR prescription for a total of the 3 Adderall XR scripts at her pharmacy. ?She continues the short acting, however she would just have to take it twice a day (or at least 1 tab am and 1/2 tab around 1 pm).  ? ?I apologize, it was my error.  ?

## 2022-03-09 NOTE — Telephone Encounter (Signed)
Please advise 

## 2022-03-09 NOTE — Telephone Encounter (Signed)
Patient has question regarding Adderrall prescription.  Patient states she gets extended release and she picked up non-extended release. ? ?Please call [phone] ? ?

## 2022-03-09 NOTE — Telephone Encounter (Signed)
LVM for pt to CB regarding medication.  ?

## 2022-03-10 NOTE — Telephone Encounter (Signed)
Pt informed

## 2022-05-16 ENCOUNTER — Encounter: Payer: Self-pay | Admitting: Family Medicine

## 2022-05-18 ENCOUNTER — Ambulatory Visit: Payer: Commercial Managed Care - PPO | Admitting: Family Medicine

## 2022-06-01 ENCOUNTER — Encounter: Payer: Self-pay | Admitting: Family Medicine

## 2022-06-01 ENCOUNTER — Ambulatory Visit: Payer: Commercial Managed Care - PPO | Admitting: Family Medicine

## 2022-06-01 VITALS — BP 127/86 | HR 113 | Temp 98.0°F | Ht 63.5 in | Wt 168.2 lb

## 2022-06-01 DIAGNOSIS — F988 Other specified behavioral and emotional disorders with onset usually occurring in childhood and adolescence: Secondary | ICD-10-CM | POA: Diagnosis not present

## 2022-06-01 DIAGNOSIS — F1510 Other stimulant abuse, uncomplicated: Secondary | ICD-10-CM | POA: Diagnosis not present

## 2022-06-01 MED ORDER — AMPHETAMINE-DEXTROAMPHET ER 20 MG PO CP24: 20.0000 mg | ORAL_CAPSULE | Freq: Every day | ORAL | 0 refills | Status: DC

## 2022-06-01 MED ORDER — AMPHETAMINE-DEXTROAMPHET ER 20 MG PO CP24: 20.0000 mg | ORAL_CAPSULE | ORAL | 0 refills | Status: DC

## 2022-06-01 NOTE — Patient Instructions (Signed)
No follow-ups on file.        Great to see you today.  I have refilled the medication(s) we provide.   If labs were collected, we will inform you of lab results once received either by echart message or telephone call.   - echart message- for normal results that have been seen by the patient already.   - telephone call: abnormal results or if patient has not viewed results in their echart.  

## 2022-06-01 NOTE — Progress Notes (Signed)
Patient ID: April Bernard, female  DOB: 1986/09/09, 36 y.o.   MRN: [id number] Patient Care Team    Relationship Specialty Notifications Start End  Ma Hillock, DO PCP - General Family Medicine  06/06/18   Brien Few, MD Consulting Physician Obstetrics and Gynecology  05/15/20     Chief Complaint  Patient presents with   ADHD    Pt is not fasting    Subjective: April Bernard is a 36 y.o.  Female  present for [facility] All past medical history, surgical history, allergies, family history, immunizations, medications and social history were updated in the electronic medical record today. All recent labs, ED visits and hospitalizations within the last year were reviewed.  ADD/controlled substance agreement in place:  Pt reports she was diagnosed with ADD in middle school. She has been on Adderall off and on over the years, with discontinuation in high school, when she lost insurance  She was established with a psychiatrist in 2013 that restarted the medication for her.  Patient discontinued her Adderall upon pregnancy.   -Adderall ER 20 mg daily has her ADD is well controlled . she denies negative SE.Indication: ADD Medication and dose: Adderall XR 20 mg QD # pills per month: 30 Last UDS date: UTD contract signed (Y/N): Y Date narcotic database last reviewed (include red flags): 06/01/22      03/07/2022   10:02 AM 09/15/2021    2:26 PM 03/04/2021   10:58 AM 05/20/2019   11:09 AM 11/28/2018    8:08 AM  Depression screen PHQ 2/9  Decreased Interest 0 0 0 0 0  Down, Depressed, Hopeless 0 0 0 0 0  PHQ - 2 Score 0 0 0 0 0  Altered sleeping    0   Tired, decreased energy    0   Change in appetite    0   Feeling bad or failure about yourself     0   Trouble concentrating    0   Moving slowly or fidgety/restless    0   Suicidal thoughts    0   PHQ-9 Score    0     Immunization History  Administered Date(s) Administered   Influenza,inj,Quad PF,6+ Mos 09/19/2017, 09/01/2018    Influenza-Unspecified 08/24/2015, 08/23/2020   PFIZER(Purple Top)SARS-COV-2 Vaccination 01/05/2020, 02/05/2020, 08/23/2020   Tdap 05/08/2014, 12/02/2019   Past Medical History:  Diagnosis Date   Acute cholecystitis 07/27/2020   ADD (attention deficit disorder)    Allergic rhinitis    Chicken pox    Cholelithiases 06/26/2020   Depression    Ganglion cyst of right foot    History of pregnancy induced hypertension    Preeclampsia    2018   Vaginal Pap smear, abnormal    No Known Allergies Past Surgical History:  Procedure Laterality Date   CESAREAN SECTION  11/08/2016   CESAREAN SECTION N/A 03/04/2020   Procedure: Repeat CESAREAN SECTION;  Surgeon: Brien Few, MD;  Location: [facility];  Service: Obstetrics;  Laterality: N/A;  EDD: 03/10/20   CHOLECYSTECTOMY N/A 07/27/2020   Procedure: LAPAROSCOPIC CHOLECYSTECTOMY WITH POSSIBLE  INTRAOPERATIVE CHOLANGIOGRAM;  Surgeon: Johnathan Hausen, MD;  Location: [facility];  Service: General;  Laterality: N/A;   REFRACTIVE SURGERY Bilateral 2011   Family History  Problem Relation Age of Onset   Arthritis Mother    Hypertension Mother    Hyperlipidemia Father    Arthritis Maternal Grandmother    Hypertension Maternal Grandmother    Hypertension Maternal Grandfather  Heart disease Maternal Grandfather    Diabetes Maternal Grandfather    Hypertension Paternal Grandmother    Hyperlipidemia Paternal Grandmother    Breast cancer Paternal Grandmother    Hypertension Paternal Grandfather    Hyperlipidemia Paternal Grandfather    Prostate cancer Paternal Grandfather    Social History   Social History Narrative   Marital status/children/pets: married, 1 child.    Education/employment: B.S. Degree, works in [employer].   Safety:      -Wears a bicycle helmet riding a bike: No     -smoke alarm in the home:Yes     - wears seatbelt: Yes     - Feels safe in their relationships: Yes    Allergies as of 06/01/2022   No Known Allergies       Medication List        Accurate as of June 01, 2022 10:25 AM. If you have any questions, ask your nurse or doctor.          amphetamine-dextroamphetamine 20 MG 24 hr capsule Commonly known as: Adderall XR Take 1 capsule (20 mg total) by mouth daily. What changed: Another medication with the same name was added. Make sure you understand how and when to take each. Changed by: Felix Pacini, DO   amphetamine-dextroamphetamine 20 MG 24 hr capsule Commonly known as: Adderall XR Take 1 capsule (20 mg total) by mouth daily. What changed: Another medication with the same name was added. Make sure you understand how and when to take each. Changed by: Felix Pacini, DO   amphetamine-dextroamphetamine 20 MG 24 hr capsule Commonly known as: ADDERALL XR Take 1 capsule (20 mg total) by mouth every morning. What changed: You were already taking a medication with the same name, and this prescription was added. Make sure you understand how and when to take each. Changed by: Felix Pacini, DO   buPROPion 150 MG 24 hr tablet Commonly known as: WELLBUTRIN XL Take 150 mg by mouth every morning.   fluticasone 50 MCG/ACT nasal spray Commonly known as: FLONASE Place into both nostrils daily.   loratadine 10 MG tablet Commonly known as: CLARITIN Take 10 mg by mouth daily.   PATADAY OP Apply to eye.        All past medical history, surgical history, allergies, family history, immunizations andmedications were updated in the EMR today and reviewed under the history and medication portions of their EMR.      ROS 14 pt review of systems performed and negative (unless mentioned in an HPI)  Objective: BP 127/86   Pulse (!) 113   Temp 98 F (36.7 C)   Ht 5' 3.5" (1.613 m)   Wt 168 lb 3.2 oz (76.3 kg)   SpO2 100%   BMI 29.33 kg/m  Physical Exam Vitals and nursing note reviewed.  Constitutional:      General: She is not in acute distress.    Appearance: Normal appearance. She is  obese. She is not ill-appearing or toxic-appearing.  HENT:     Head: Normocephalic and atraumatic.     Right Ear: Tympanic membrane, ear canal and external ear normal. There is no impacted cerumen.     Left Ear: Tympanic membrane, ear canal and external ear normal. There is no impacted cerumen.     Nose: No congestion or rhinorrhea.     Mouth/Throat:     Mouth: Mucous membranes are moist.     Pharynx: Oropharynx is clear. No oropharyngeal exudate or posterior oropharyngeal erythema.  Eyes:  General: No scleral icterus.       Right eye: No discharge.        Left eye: No discharge.     Extraocular Movements: Extraocular movements intact.     Conjunctiva/sclera: Conjunctivae normal.     Pupils: Pupils are equal, round, and reactive to light.  Cardiovascular:     Rate and Rhythm: Normal rate and regular rhythm.     Pulses: Normal pulses.     Heart sounds: Normal heart sounds. No murmur heard.    No friction rub. No gallop.  Pulmonary:     Effort: Pulmonary effort is normal. No respiratory distress.     Breath sounds: Normal breath sounds. No stridor. No wheezing, rhonchi or rales.  Chest:     Chest wall: No tenderness.  Abdominal:     General: Abdomen is flat. Bowel sounds are normal. There is no distension.     Palpations: Abdomen is soft. There is no mass.     Tenderness: There is no abdominal tenderness. There is no right CVA tenderness, left CVA tenderness, guarding or rebound.     Hernia: No hernia is present.  Musculoskeletal:        General: No swelling, tenderness or deformity. Normal range of motion.     Cervical back: Normal range of motion and neck supple. No rigidity or tenderness.     Right lower leg: No edema.     Left lower leg: No edema.  Lymphadenopathy:     Cervical: No cervical adenopathy.  Skin:    General: Skin is warm and dry.     Coloration: Skin is not jaundiced or pale.     Findings: No bruising, erythema, lesion or rash.  Neurological:     General:  No focal deficit present.     Mental Status: She is alert and oriented to person, place, and time. Mental status is at baseline.     Cranial Nerves: No cranial nerve deficit.     Sensory: No sensory deficit.     Motor: No weakness.     Coordination: Coordination normal.     Gait: Gait normal.     Deep Tendon Reflexes: Reflexes normal.  Psychiatric:        Mood and Affect: Mood normal.        Behavior: Behavior normal.        Thought Content: Thought content normal.        Judgment: Judgment normal.      No results found.  Assessment/plan: April Bernard is a 36 y.o. 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   Indication: ADD Medication and dose: Adderall XR 20 mg QD # pills per month: 30 Last UDS date: collected today contract signed (Y/N): updated 06/01/2022 Date narcotic database last reviewed (include red flags):06/01/22 Refills provided today for 3 mos - follow up 3 mos on ADD  Return in about 11 weeks (around 08/17/2022) for Routine chronic condition follow-up.  Orders Placed This Encounter  Procedures   Drug Monitoring Panel [phone] , Urine    Meds ordered this encounter  Medications   amphetamine-dextroamphetamine (ADDERALL XR) 20 MG 24 hr capsule    Sig: Take 1 capsule (20 mg total) by mouth daily.    Dispense:  30 capsule    Refill:  0    May refill approximately 60 days after prescribe date  amphetamine-dextroamphetamine (ADDERALL XR) 20 MG 24 hr capsule    Sig: Take 1 capsule (20 mg total) by mouth daily.    Dispense:  30 capsule    Refill:  0   amphetamine-dextroamphetamine (ADDERALL XR) 20 MG 24 hr capsule    Sig: Take 1 capsule (20 mg total) by mouth every morning.    Dispense:  30 capsule    Refill:  0    ~85d after prescribed date   Referral Orders  No  referral(s) requested today     Electronically signed by: Howard Pouch, [place]

## 2022-06-03 LAB — DRUG MONITORING PANEL 376104, URINE
Amphetamine: 2365 ng/mL — ABNORMAL HIGH (ref <250)
Amphetamines: POSITIVE ng/mL — AB (ref <500)
Barbiturates: NEGATIVE ng/mL (ref <300)
Benzodiazepines: NEGATIVE ng/mL (ref <100)
Cocaine Metabolite: NEGATIVE ng/mL (ref <150)
Desmethyltramadol: NEGATIVE ng/mL (ref <100)
Methamphetamine: NEGATIVE ng/mL (ref <250)
Opiates: NEGATIVE ng/mL (ref <100)
Oxycodone: NEGATIVE ng/mL (ref <100)
Tramadol: NEGATIVE ng/mL (ref <100)

## 2022-06-03 LAB — DM TEMPLATE

## 2022-07-21 ENCOUNTER — Encounter: Payer: Self-pay | Admitting: Family Medicine

## 2022-07-21 NOTE — Telephone Encounter (Signed)
Called pharmacy and confirmed last Adderall 20mg  picked up on 8/19. She currently has 2 prescriptions on hold (do not fill of 10/1 and 10/25) and one from July 7 that has not been used. Pharmacy can use this one to fill currently.

## 2022-08-18 ENCOUNTER — Ambulatory Visit: Payer: Commercial Managed Care - PPO | Admitting: Family Medicine

## 2022-10-13 ENCOUNTER — Ambulatory Visit (INDEPENDENT_AMBULATORY_CARE_PROVIDER_SITE_OTHER): Payer: Commercial Managed Care - PPO | Admitting: Family Medicine

## 2022-10-13 ENCOUNTER — Encounter: Payer: Self-pay | Admitting: Family Medicine

## 2022-10-13 VITALS — BP 126/73 | HR 82 | Temp 97.8°F | Ht 63.5 in | Wt 161.0 lb

## 2022-10-13 DIAGNOSIS — F988 Other specified behavioral and emotional disorders with onset usually occurring in childhood and adolescence: Secondary | ICD-10-CM

## 2022-10-13 MED ORDER — AMPHETAMINE-DEXTROAMPHETAMINE 20 MG PO TABS: ORAL_TABLET | ORAL | 0 refills | Status: DC

## 2022-10-13 NOTE — Progress Notes (Signed)
Patient ID: April Bernard, female  DOB: [birth year], 36 y.o.   MRN: [id number] Patient Care Team    Relationship Specialty Notifications Start End  Natalia Leatherwood, DO PCP - General Family Medicine  06/06/18   Olivia Mackie, MD Consulting Physician Obstetrics and Gynecology  05/15/20     Chief Complaint  Patient presents with   ADHD    Cmc; pt is not fasting     Subjective: April Bernard is a 36 y.o.  Female  present for [facility] All past medical history, surgical history, allergies, family history, immunizations, medications and social history were updated in the electronic medical record today. All recent labs, ED visits and hospitalizations within the last year were reviewed.  ADD/controlled substance agreement in place:  Pt reports she was diagnosed with ADD in middle school. She has been on Adderall off and on over the years, with discontinuation in high school, when she lost insurance  She was established with a psychiatrist in 2013 that restarted the medication for her. Patient then discontinued her Adderall upon pregnancy.   -Adderall ER 20 mg daily has her ADD is controlled, but she reports she liked the short acting.  - pt is compliant. she denies negative SE.Indication: ADD Medication and dose: Adderall XR 20 mg QD # pills per month: 30 contract signed (Y/N): Y Date narcotic database last reviewed (include red flags): 10/13/22      10/13/2022    9:42 AM 03/07/2022   10:02 AM 09/15/2021    2:26 PM 03/04/2021   10:58 AM 05/20/2019   11:09 AM  Depression screen PHQ 2/9  Decreased Interest 0 0 0 0 0  Down, Depressed, Hopeless 0 0 0 0 0  PHQ - 2 Score 0 0 0 0 0  Altered sleeping     0  Tired, decreased energy     0  Change in appetite     0  Feeling bad or failure about yourself      0  Trouble concentrating     0  Moving slowly or fidgety/restless     0  Suicidal thoughts     0  PHQ-9 Score     0    Immunization History  Administered Date(s) Administered    Influenza,inj,Quad PF,6+ Mos 09/19/2017, 09/01/2018   Influenza-Unspecified 08/24/2015, 08/23/2020   PFIZER(Purple Top)SARS-COV-2 Vaccination 01/05/2020, 02/05/2020, 08/23/2020   Tdap 05/08/2014, 12/02/2019   Past Medical History:  Diagnosis Date   Acute cholecystitis 07/27/2020   ADD (attention deficit disorder)    Allergic rhinitis    Chicken pox    Cholelithiases 06/26/2020   Depression    Ganglion cyst of right foot    History of pregnancy induced hypertension    Preeclampsia    2018   Vaginal Pap smear, abnormal    No Known Allergies Past Surgical History:  Procedure Laterality Date   CESAREAN SECTION  11/08/2016   CESAREAN SECTION N/A 03/04/2020   Procedure: Repeat CESAREAN SECTION;  Surgeon: Olivia Mackie, MD;  Location: [facility];  Service: Obstetrics;  Laterality: N/A;  EDD: 03/10/20   CHOLECYSTECTOMY N/A 07/27/2020   Procedure: LAPAROSCOPIC CHOLECYSTECTOMY WITH POSSIBLE  INTRAOPERATIVE CHOLANGIOGRAM;  Surgeon: Luretha Murphy, MD;  Location: [facility];  Service: General;  Laterality: N/A;   REFRACTIVE SURGERY Bilateral 2011   Family History  Problem Relation Age of Onset   Arthritis Mother    Hypertension Mother    Hyperlipidemia Father    Arthritis Maternal Grandmother    Hypertension Maternal Grandmother  Hypertension Maternal Grandfather    Heart disease Maternal Grandfather    Diabetes Maternal Grandfather    Hypertension Paternal Grandmother    Hyperlipidemia Paternal Grandmother    Breast cancer Paternal Grandmother    Hypertension Paternal Grandfather    Hyperlipidemia Paternal Grandfather    Prostate cancer Paternal Grandfather    Social History   Social History Narrative   Marital status/children/pets: married, 1 child.    Education/employment: B.S. Degree, works in [employer].   Safety:      -Wears a bicycle helmet riding a bike: No     -smoke alarm in the home:Yes     - wears seatbelt: Yes     - Feels safe in their relationships: Yes     Allergies as of 10/13/2022   No Known Allergies      Medication List        Accurate as of October 13, 2022 10:14 AM. If you have any questions, ask your nurse or doctor.          amphetamine-dextroamphetamine 20 MG 24 hr capsule Commonly known as: Adderall XR Take 1 capsule (20 mg total) by mouth daily. What changed: Another medication with the same name was added. Make sure you understand how and when to take each. Changed by: Howard Pouch, DO   amphetamine-dextroamphetamine 20 MG 24 hr capsule Commonly known as: Adderall XR Take 1 capsule (20 mg total) by mouth daily. What changed: Another medication with the same name was added. Make sure you understand how and when to take each. Changed by: Howard Pouch, DO   amphetamine-dextroamphetamine 20 MG 24 hr capsule Commonly known as: ADDERALL XR Take 1 capsule (20 mg total) by mouth every morning. What changed: Another medication with the same name was added. Make sure you understand how and when to take each. Changed by: Howard Pouch, DO   amphetamine-dextroamphetamine 20 MG tablet Commonly known as: ADDERALL 1 tab in the morning and 1/2 tab in the afternoon. What changed: You were already taking a medication with the same name, and this prescription was added. Make sure you understand how and when to take each. Changed by: Howard Pouch, DO   buPROPion 150 MG 24 hr tablet Commonly known as: WELLBUTRIN XL Take 150 mg by mouth every morning.   fluticasone 50 MCG/ACT nasal spray Commonly known as: FLONASE Place into both nostrils daily.   loratadine 10 MG tablet Commonly known as: CLARITIN Take 10 mg by mouth daily.   PATADAY OP Apply to eye.        All past medical history, surgical history, allergies, family history, immunizations andmedications were updated in the EMR today and reviewed under the history and medication portions of their EMR.      ROS 14 pt review of systems performed and negative  (unless mentioned in an HPI)  Objective: BP 126/73   Pulse 82   Temp 97.8 F (36.6 C) (Oral)   Ht 5' 3.5" (1.613 m)   Wt 161 lb (73 kg)   LMP 10/04/2022   SpO2 100%   BMI 28.07 kg/m  Physical Exam Vitals and nursing note reviewed.  Constitutional:      General: She is not in acute distress.    Appearance: Normal appearance. She is normal weight. She is not ill-appearing or toxic-appearing.  HENT:     Head: Normocephalic and atraumatic.  Eyes:     Extraocular Movements: Extraocular movements intact.     Conjunctiva/sclera: Conjunctivae normal.     Pupils:  Pupils are equal, round, and reactive to light.  Cardiovascular:     Rate and Rhythm: Normal rate and regular rhythm.     Heart sounds: No murmur heard. Neurological:     Mental Status: She is alert and oriented to person, place, and time. Mental status is at baseline.  Psychiatric:        Mood and Affect: Mood normal.        Behavior: Behavior normal.        Thought Content: Thought content normal.        Judgment: Judgment normal.      No results found.  Assessment/plan: April Bernard is a 36 y.o. female present for [facility] Attention deficit disorder (ADD) without hyperactivity/Controlled substance agreement signed stable If Product backorder at her pharmacy will provide Concerta 40 Insurance required 30 d scripts only.   Indication: ADD Medication and dose: Adderall XR 20 mg QD # pills per month: 30 contract signed (Y/N): updated 06/01/2022 Date narcotic database last reviewed (include red flags):10/13/22 Refills provided today for 1 mos- she is testing the short acting script.  - She will call in 3-4 weeks and let us know if she desires the last 2 scripts to be XR or not. - follow up 3 mos on ADD  Return in about 11 weeks (around 12/29/2022).  No orders of the defined types were placed in this encounter.   Meds ordered this encounter  Medications   amphetamine-dextroamphetamine (ADDERALL) 20 MG tablet     Sig: 1 tab in the morning and 1/2 tab in the afternoon.    Dispense:  45 tablet    Refill:  0   Referral Orders  No referral(s) requested today     Electronically signed by: Felix Pacini, DO [facility]

## 2022-10-13 NOTE — Patient Instructions (Addendum)
Return in about 11 weeks (around 12/29/2022).        Great to see you today.

## 2022-11-11 ENCOUNTER — Other Ambulatory Visit: Payer: Self-pay | Admitting: Family Medicine

## 2022-11-11 MED ORDER — AMPHETAMINE-DEXTROAMPHETAMINE 20 MG PO TABS: ORAL_TABLET | ORAL | 0 refills | Status: DC

## 2022-11-11 NOTE — Telephone Encounter (Signed)
completed

## 2022-12-29 ENCOUNTER — Encounter: Payer: Self-pay | Admitting: Family Medicine

## 2022-12-29 ENCOUNTER — Ambulatory Visit (INDEPENDENT_AMBULATORY_CARE_PROVIDER_SITE_OTHER): Payer: Commercial Managed Care - PPO | Admitting: Family Medicine

## 2022-12-29 DIAGNOSIS — F988 Other specified behavioral and emotional disorders with onset usually occurring in childhood and adolescence: Secondary | ICD-10-CM | POA: Diagnosis not present

## 2022-12-29 DIAGNOSIS — Z79899 Other long term (current) drug therapy: Secondary | ICD-10-CM | POA: Diagnosis not present

## 2022-12-29 MED ORDER — AMPHETAMINE-DEXTROAMPHETAMINE 20 MG PO TABS
ORAL_TABLET | ORAL | 0 refills | Status: DC
Start: 2022-12-29 — End: 2023-05-05

## 2022-12-29 MED ORDER — AMPHETAMINE-DEXTROAMPHETAMINE 20 MG PO TABS: ORAL_TABLET | ORAL | 0 refills | Status: DC

## 2022-12-29 NOTE — Progress Notes (Signed)
Patient ID: April Bernard, female  DOB: 1986/08/02, 37 y.o.   MRN: [id number] Patient Care Team    Relationship Specialty Notifications Start End  Ma Hillock, DO PCP - General Family Medicine  06/06/18   Brien Few, MD Consulting Physician Obstetrics and Gynecology  05/15/20      VIRTUAL VISIT VIA VIDEO  I connected with Jhada Dehnert on 12/29/22 at 10:20 AM EST by a video enabled telemedicine application and verified that I am speaking with the correct person using two identifiers. Location patient: Home Location provider: [facility], Office Persons participating in the virtual visit: Patient, Dr. Raoul Pitch and Marlou Porch, CMA  I discussed the limitations of evaluation and management by telemedicine and the availability of in person appointments. The patient expressed understanding and agreed to proceed.     Chief Complaint  Patient presents with   ADD    Subjective: April Bernard is a 37 y.o.  Female  present for Chronic Conditions/illness Management  All past medical history, surgical history, allergies, family history, immunizations, medications and social history were updated in the electronic medical record today. All recent labs, ED visits and hospitalizations within the last year were reviewed.  ADD/controlled substance agreement in place:  Pt reports she was diagnosed with ADD in middle school.  She has been prescribed Adderall off and on over the years, with discontinuation in high school, when she lost insurance  She was established with a psychiatrist in 2013 that restarted the medication for her. Patient then discontinued her Adderall upon pregnancy.   She reports Adderall ER 20 mg daily is helping her focus well.  Patient is compliant with prescription.  She denies negative SE. Indication: ADD Medication and dose: Adderall  20/10 mg QD # pills per month: 50 contract signed (Y/N): Y Date narcotic database last reviewed (include red flags):  12/29/22      12/29/2022    9:11 AM 10/13/2022    9:42 AM 03/07/2022   10:02 AM 09/15/2021    2:26 PM 03/04/2021   10:58 AM  Depression screen PHQ 2/9  Decreased Interest 0 0 0 0 0  Down, Depressed, Hopeless 0 0 0 0 0  PHQ - 2 Score 0 0 0 0 0    Immunization History  Administered Date(s) Administered   Influenza,inj,Quad PF,6+ Mos 09/19/2017, 09/01/2018   Influenza-Unspecified 08/24/2015, 08/23/2020   PFIZER(Purple Top)SARS-COV-2 Vaccination 01/05/2020, 02/05/2020, 08/23/2020   Tdap 05/08/2014, 12/02/2019   Past Medical History:  Diagnosis Date   Acute cholecystitis 07/27/2020   ADD (attention deficit disorder)    Allergic rhinitis    Chicken pox    Cholelithiases 06/26/2020   Depression    Ganglion cyst of right foot    History of pregnancy induced hypertension    Preeclampsia    2018   Vaginal Pap smear, abnormal    No Known Allergies Past Surgical History:  Procedure Laterality Date   CESAREAN SECTION  11/08/2016   CESAREAN SECTION N/A 03/04/2020   Procedure: Repeat CESAREAN SECTION;  Surgeon: Brien Few, MD;  Location: [facility];  Service: Obstetrics;  Laterality: N/A;  EDD: 03/10/20   CHOLECYSTECTOMY N/A 07/27/2020   Procedure: LAPAROSCOPIC CHOLECYSTECTOMY WITH POSSIBLE  INTRAOPERATIVE CHOLANGIOGRAM;  Surgeon: Johnathan Hausen, MD;  Location: [facility];  Service: General;  Laterality: N/A;   REFRACTIVE SURGERY Bilateral 2011   Family History  Problem Relation Age of Onset   Arthritis Mother    Hypertension Mother    Hyperlipidemia Father    Arthritis  Maternal Grandmother    Hypertension Maternal Grandmother    Hypertension Maternal Grandfather    Heart disease Maternal Grandfather    Diabetes Maternal Grandfather    Hypertension Paternal Grandmother    Hyperlipidemia Paternal Grandmother    Breast cancer Paternal Grandmother    Hypertension Paternal Grandfather    Hyperlipidemia Paternal Grandfather    Prostate cancer Paternal Grandfather    Social History    Social History Narrative   Marital status/children/pets: married, 1 child.    Education/employment: B.S. Degree, works in [employer].   Safety:      -Wears a bicycle helmet riding a bike: No     -smoke alarm in the home:Yes     - wears seatbelt: Yes     - Feels safe in their relationships: Yes    Allergies as of 12/29/2022   No Known Allergies      Medication List        Accurate as of December 29, 2022  9:18 AM. If you have any questions, ask your nurse or doctor.          amphetamine-dextroamphetamine 20 MG tablet Commonly known as: Adderall One tab PO in the morning and 1/2 tab in the afternoon What changed: Another medication with the same name was added. Make sure you understand how and when to take each. Changed by: Howard Pouch, DO   amphetamine-dextroamphetamine 20 MG tablet Commonly known as: ADDERALL 1 tab in the morning and 1/2 tab in the afternoon. What changed: Another medication with the same name was added. Make sure you understand how and when to take each. Changed by: Howard Pouch, DO   amphetamine-dextroamphetamine 20 MG tablet Commonly known as: Adderall One tab PO in the morning and 1/2 tab in the afternoon What changed: You were already taking a medication with the same name, and this prescription was added. Make sure you understand how and when to take each. Changed by: Howard Pouch, DO   buPROPion 150 MG 24 hr tablet Commonly known as: WELLBUTRIN XL Take 150 mg by mouth every morning.   fluticasone 50 MCG/ACT nasal spray Commonly known as: FLONASE Place into both nostrils daily.   loratadine 10 MG tablet Commonly known as: CLARITIN Take 10 mg by mouth daily.   PATADAY OP Apply to eye.        All past medical history, surgical history, allergies, family history, immunizations andmedications were updated in the EMR today and reviewed under the history and medication portions of their EMR.      ROS 14 pt review of systems  performed and negative (unless mentioned in an HPI)  Objective: There were no vitals taken for this visit. Physical Exam Vitals and nursing note reviewed.  Constitutional:      General: She is not in acute distress.    Appearance: Normal appearance. She is not toxic-appearing.  HENT:     Head: Normocephalic and atraumatic.  Eyes:     General: No scleral icterus.       Right eye: No discharge.        Left eye: No discharge.     Conjunctiva/sclera: Conjunctivae normal.  Pulmonary:     Effort: Pulmonary effort is normal.  Musculoskeletal:     Cervical back: Normal range of motion.  Skin:    Findings: No rash.  Neurological:     Mental Status: She is alert and oriented to person, place, and time. Mental status is at baseline.  Psychiatric:  Mood and Affect: Mood normal.        Behavior: Behavior normal.        Thought Content: Thought content normal.        Judgment: Judgment normal.      No results found.  Assessment/plan: Jesselle Sandi is a 37 y.o. female present for Chronic Conditions/illness Management Attention deficit disorder (ADD) without hyperactivity/Controlled substance agreement signed Stable Continue Adderall 20/10 mg daily Indication: ADD Medication and dose: Adderall 20 mg QD # pills per month: 45 contract signed (Y/N): updated 06/01/2022 Date narcotic database last reviewed (include red flags):12/29/22 - follow up 3 mos on ADD  No follow-ups on file.  No orders of the defined types were placed in this encounter.   Meds ordered this encounter  Medications   amphetamine-dextroamphetamine (ADDERALL) 20 MG tablet    Sig: One tab PO in the morning and 1/2 tab in the afternoon    Dispense:  45 tablet    Refill:  0    May fill ~25 days after date written   amphetamine-dextroamphetamine (ADDERALL) 20 MG tablet    Sig: 1 tab in the morning and 1/2 tab in the afternoon.    Dispense:  45 tablet    Refill:  0   amphetamine-dextroamphetamine  (ADDERALL) 20 MG tablet    Sig: One tab PO in the morning and 1/2 tab in the afternoon    Dispense:  45 tablet    Refill:  0    May fill ~55 days after date written   Referral Orders  No referral(s) requested today     Electronically signed by: Howard Pouch, [place]

## 2022-12-29 NOTE — Patient Instructions (Signed)
No follow-ups on file.        Great to see you today.  I have refilled the medication(s) we provide.   If labs were collected, we will inform you of lab results once received either by echart message or telephone call.   - echart message- for normal results that have been seen by the patient already.   - telephone call: abnormal results or if patient has not viewed results in their echart.  

## 2023-01-03 ENCOUNTER — Ambulatory Visit: Payer: Commercial Managed Care - PPO | Admitting: Family Medicine

## 2023-05-05 ENCOUNTER — Encounter: Payer: Self-pay | Admitting: Family Medicine

## 2023-05-05 ENCOUNTER — Ambulatory Visit: Payer: Commercial Managed Care - PPO | Admitting: Family Medicine

## 2023-05-05 VITALS — BP 115/80 | HR 87 | Temp 97.6°F | Ht 63.5 in | Wt 146.8 lb

## 2023-05-05 DIAGNOSIS — F988 Other specified behavioral and emotional disorders with onset usually occurring in childhood and adolescence: Secondary | ICD-10-CM | POA: Diagnosis not present

## 2023-05-05 DIAGNOSIS — Z79899 Other long term (current) drug therapy: Secondary | ICD-10-CM | POA: Diagnosis not present

## 2023-05-05 MED ORDER — AMPHETAMINE-DEXTROAMPHETAMINE 20 MG PO TABS: ORAL_TABLET | ORAL | 0 refills | Status: DC

## 2023-05-05 NOTE — Progress Notes (Signed)
Patient ID: [name], female  DOB: 20-Sep-1986, 37 y.o.   MRN: [id number] Patient Care Team    Relationship Specialty Notifications Start End  Natalia Leatherwood, DO PCP - General Family Medicine  06/06/18   Olivia Mackie, MD Consulting Physician Obstetrics and Gynecology  05/15/20      Chief Complaint  Patient presents with   ADD    Subjective: April Bernard is a 37 y.o.  Female  present for Chronic Conditions/illness Management  All past medical history, surgical history, allergies, family history, immunizations, medications and social history were updated in the electronic medical record today. All recent labs, ED visits and hospitalizations within the last year were reviewed.  ADD/controlled substance agreement in place:  Pt reports she was diagnosed with ADD in middle school.  She has been prescribed Adderall off and on over the years, with discontinuation in high school, when she lost insurance  She was established with a psychiatrist in 2013 that restarted the medication for her.   She reports Adderall ER 20 mg daily helped her maintain focus. Patient is compliant with prescription.  She denies negative SE. Indication: ADD Medication and dose: Adderall  20/10 mg QD # pills per month: 45 contract signed (Y/N): Y Date narcotic database last reviewed (include red flags): 05/05/23      05/05/2023    8:45 AM 12/29/2022    9:11 AM 10/13/2022    9:42 AM 03/07/2022   10:02 AM 09/15/2021    2:26 PM  Depression screen PHQ 2/9  Decreased Interest 0 0 0 0 0  Down, Depressed, Hopeless 0 0 0 0 0  PHQ - 2 Score 0 0 0 0 0    Immunization History  Administered Date(s) Administered   Influenza,inj,Quad PF,6+ Mos 09/19/2017, 09/01/2018   Influenza-Unspecified 08/24/2015, 08/23/2020   PFIZER(Purple Top)SARS-COV-2 Vaccination 01/05/2020, 02/05/2020, 08/23/2020   Tdap 05/08/2014, 12/02/2019   Past Medical History:  Diagnosis Date   Acute cholecystitis 07/27/2020   ADD  (attention deficit disorder)    Allergic rhinitis    Chicken pox    Cholelithiases 06/26/2020   Depression    Ganglion cyst of right foot    History of pregnancy induced hypertension    Preeclampsia    2018   Vaginal Pap smear, abnormal    No Known Allergies Past Surgical History:  Procedure Laterality Date   CESAREAN SECTION  11/08/2016   CESAREAN SECTION N/A 03/04/2020   Procedure: Repeat CESAREAN SECTION;  Surgeon: Olivia Mackie, MD;  Location: [facility];  Service: Obstetrics;  Laterality: N/A;  EDD: 03/10/20   CHOLECYSTECTOMY N/A 07/27/2020   Procedure: LAPAROSCOPIC CHOLECYSTECTOMY WITH POSSIBLE  INTRAOPERATIVE CHOLANGIOGRAM;  Surgeon: Luretha Murphy, MD;  Location: [facility];  Service: General;  Laterality: N/A;   REFRACTIVE SURGERY Bilateral 2011   Family History  Problem Relation Age of Onset   Arthritis Mother    Hypertension Mother    Hyperlipidemia Father    Arthritis Maternal Grandmother    Hypertension Maternal Grandmother    Hypertension Maternal Grandfather    Heart disease Maternal Grandfather    Diabetes Maternal Grandfather    Hypertension Paternal Grandmother    Hyperlipidemia Paternal Grandmother    Breast cancer Paternal Grandmother    Hypertension Paternal Grandfather    Hyperlipidemia Paternal Grandfather    Prostate cancer Paternal Grandfather    Social History   Social History Narrative   Marital status/children/pets: married, 1 child.    Education/employment: B.S. Degree, works in [employer].   Safety:      -  Wears a bicycle helmet riding a bike: No     -smoke alarm in the home:Yes     - wears seatbelt: Yes     - Feels safe in their relationships: Yes    Allergies as of 05/05/2023   No Known Allergies      Medication List        Accurate as of May 05, 2023  9:05 AM. If you have any questions, ask your nurse or doctor.          STOP taking these medications    buPROPion 150 MG 24 hr tablet Commonly known as: WELLBUTRIN  XL Stopped by: Felix Pacini, DO       TAKE these medications    amphetamine-dextroamphetamine 20 MG tablet Commonly known as: ADDERALL 1 tab in the morning and 1/2 tab in the afternoon. What changed: Another medication with the same name was changed. Make sure you understand how and when to take each. Changed by: Felix Pacini, DO   amphetamine-dextroamphetamine 20 MG tablet Commonly known as: Adderall One tab PO in the morning and 1/2 tab in the afternoon Start taking on: May 31, 2023 What changed: These instructions start on May 31, 2023. If you are unsure what to do until then, ask your doctor or other care provider. Changed by: Felix Pacini, DO   amphetamine-dextroamphetamine 20 MG tablet Commonly known as: Adderall One tab PO in the morning and 1/2 tab in the afternoon Start taking on: June 27, 2023 What changed: These instructions start on June 27, 2023. If you are unsure what to do until then, ask your doctor or other care provider. Changed by: Felix Pacini, DO   fluticasone 50 MCG/ACT nasal spray Commonly known as: FLONASE Place into both nostrils daily.   loratadine 10 MG tablet Commonly known as: CLARITIN Take 10 mg by mouth daily.   PATADAY OP Apply to eye.        All past medical history, surgical history, allergies, family history, immunizations andmedications were updated in the EMR today and reviewed under the history and medication portions of their EMR.      ROS 14 pt review of systems performed and negative (unless mentioned in an HPI)  Objective: BP 115/80   Pulse 87   Temp 97.6 F (36.4 C)   Ht 5' 3.5" (1.613 m)   Wt 146 lb 12.8 oz (66.6 kg)   LMP 04/22/2023 (Approximate)   SpO2 100%   BMI 25.60 kg/m  Physical Exam Vitals and nursing note reviewed.  Constitutional:      General: She is not in acute distress.    Appearance: Normal appearance. She is not toxic-appearing.  HENT:     Head: Normocephalic and atraumatic.  Eyes:      General: No scleral icterus.       Right eye: No discharge.        Left eye: No discharge.     Conjunctiva/sclera: Conjunctivae normal.  Pulmonary:     Effort: Pulmonary effort is normal.  Musculoskeletal:     Cervical back: Normal range of motion.  Skin:    Findings: No rash.  Neurological:     Mental Status: She is alert and oriented to person, place, and time. Mental status is at baseline.  Psychiatric:        Mood and Affect: Mood normal.        Behavior: Behavior normal.        Thought Content: Thought content normal.  Judgment: Judgment normal.      No results found.  Assessment/plan: April Bernard is a 37 y.o. female present for Chronic Conditions/illness Management Attention deficit disorder (ADD) without hyperactivity/Controlled substance agreement signed Stable Continue Adderall 20/10 mg daily Indication: ADD Medication and dose: Adderall 20 mg QD # pills per month: 45 contract signed (Y/N): updated 06/01/2022 Date narcotic database last reviewed (include red flags):05/05/23 - follow up 3 mos on ADD  Return in about 15 weeks (around 08/18/2023) for cpe (20 min), Routine chronic condition follow-up.  No orders of the defined types were placed in this encounter.   Meds ordered this encounter  Medications   amphetamine-dextroamphetamine (ADDERALL) 20 MG tablet    Sig: 1 tab in the morning and 1/2 tab in the afternoon.    Dispense:  45 tablet    Refill:  0   amphetamine-dextroamphetamine (ADDERALL) 20 MG tablet    Sig: One tab PO in the morning and 1/2 tab in the afternoon    Dispense:  45 tablet    Refill:  0    May fill ~55 days after date written   amphetamine-dextroamphetamine (ADDERALL) 20 MG tablet    Sig: One tab PO in the morning and 1/2 tab in the afternoon    Dispense:  45 tablet    Refill:  0    May fill ~25 days after date written   Referral Orders  No referral(s) requested today     Electronically signed by: Felix Pacini,  DO [facility] [place]

## 2023-05-05 NOTE — Patient Instructions (Addendum)
Return in about 15 weeks (around 08/18/2023) for cpe (20 min), Routine chronic condition follow-up.        Great to see you today.  I have refilled the medication(s) we provide.   If labs were collected, we will inform you of lab results once received either by echart message or telephone call.   - echart message- for normal results that have been seen by the patient already.   - telephone call: abnormal results or if patient has not viewed results in their echart.

## 2023-08-07 ENCOUNTER — Encounter: Payer: Self-pay | Admitting: Family Medicine

## 2023-08-07 ENCOUNTER — Ambulatory Visit: Payer: Commercial Managed Care - PPO | Admitting: Family Medicine

## 2023-08-07 VITALS — BP 134/84 | HR 94 | Temp 97.8°F | Wt 147.2 lb

## 2023-08-07 DIAGNOSIS — F988 Other specified behavioral and emotional disorders with onset usually occurring in childhood and adolescence: Secondary | ICD-10-CM

## 2023-08-07 DIAGNOSIS — Z23 Encounter for immunization: Secondary | ICD-10-CM | POA: Diagnosis not present

## 2023-08-07 MED ORDER — AMPHETAMINE-DEXTROAMPHETAMINE 20 MG PO TABS: ORAL_TABLET | ORAL | 0 refills | Status: DC

## 2023-08-07 NOTE — Patient Instructions (Addendum)

## 2023-08-07 NOTE — Progress Notes (Signed)
Patient ID: April Bernard, female  DOB: Dec 08, 1985, 37 y.o.   MRN: [id number] Patient Care Team    Relationship Specialty Notifications Start End  Natalia Leatherwood, DO PCP - General Family Medicine  06/06/18   Olivia Mackie, MD Consulting Physician Obstetrics and Gynecology  05/15/20      Chief Complaint  Patient presents with   ADHD    Subjective: April Bernard is a 37 y.o.  Female  present for Chronic Conditions/illness Management  All past medical history, surgical history, allergies, family history, immunizations, medications and social history were updated in the electronic medical record today. All recent labs, ED visits and hospitalizations within the last year were reviewed.  ADD/controlled substance agreement in place:  Pt reports she was diagnosed with ADD in middle school.  She has been prescribed Adderall off and on over the years, with discontinuation in high school, when she lost insurance  She was established with a psychiatrist in 2013 that restarted the medication for her.   She reports Adderall ER 20 mg daily is continued to help her focus. Patient is compliant with prescription.  She denies negative SE. Indication: ADD Medication and dose: Adderall  20/10 mg QD # pills per month: 45 contract signed (Y/N): Y Date narcotic database last reviewed (include red flags): 08/07/23      08/07/2023   11:06 AM 05/05/2023    8:45 AM 12/29/2022    9:11 AM 10/13/2022    9:42 AM 03/07/2022   10:02 AM  Depression screen PHQ 2/9  Decreased Interest 0 0 0 0 0  Down, Depressed, Hopeless 1 0 0 0 0  PHQ - 2 Score 1 0 0 0 0    Immunization History  Administered Date(s) Administered   Influenza,inj,Quad PF,6+ Mos 09/19/2017, 09/01/2018   Influenza-Unspecified 08/24/2015, 08/23/2020   PFIZER(Purple Top)SARS-COV-2 Vaccination 01/05/2020, 02/05/2020, 08/23/2020   Tdap 05/08/2014, 12/02/2019   Past Medical History:  Diagnosis Date   Acute cholecystitis 07/27/2020   ADD  (attention deficit disorder)    Allergic rhinitis    Chicken pox    Cholelithiases 06/26/2020   Depression    Ganglion cyst of right foot    History of pregnancy induced hypertension    Preeclampsia    2018   Vaginal Pap smear, abnormal    No Known Allergies Past Surgical History:  Procedure Laterality Date   CESAREAN SECTION  11/08/2016   CESAREAN SECTION N/A 03/04/2020   Procedure: Repeat CESAREAN SECTION;  Surgeon: Olivia Mackie, MD;  Location: [facility];  Service: Obstetrics;  Laterality: N/A;  EDD: 03/10/20   CHOLECYSTECTOMY N/A 07/27/2020   Procedure: LAPAROSCOPIC CHOLECYSTECTOMY WITH POSSIBLE  INTRAOPERATIVE CHOLANGIOGRAM;  Surgeon: Luretha Murphy, MD;  Location: [facility];  Service: General;  Laterality: N/A;   REFRACTIVE SURGERY Bilateral 2011   Family History  Problem Relation Age of Onset   Arthritis Mother    Hypertension Mother    Hyperlipidemia Father    Arthritis Maternal Grandmother    Hypertension Maternal Grandmother    Hypertension Maternal Grandfather    Heart disease Maternal Grandfather    Diabetes Maternal Grandfather    Hypertension Paternal Grandmother    Hyperlipidemia Paternal Grandmother    Breast cancer Paternal Grandmother    Hypertension Paternal Grandfather    Hyperlipidemia Paternal Grandfather    Prostate cancer Paternal Grandfather    Social History   Social History Narrative   Marital status/children/pets: married, 1 child.    Education/employment: B.S. Degree, works in [employer].  Safety:      -Wears a bicycle helmet riding a bike: No     -smoke alarm in the home:Yes     - wears seatbelt: Yes     - Feels safe in their relationships: Yes    Allergies as of 08/07/2023   No Known Allergies      Medication List        Accurate as of August 07, 2023 11:11 AM. If you have any questions, ask your nurse or doctor.          amphetamine-dextroamphetamine 20 MG tablet Commonly known as: ADDERALL 1 tab in the morning and 1/2  tab in the afternoon. What changed: Another medication with the same name was changed. Make sure you understand how and when to take each. Changed by: Felix Pacini   amphetamine-dextroamphetamine 20 MG tablet Commonly known as: Adderall One tab PO in the morning and 1/2 tab in the afternoon Start taking on: August 31, 2023 What changed: These instructions start on August 31, 2023. If you are unsure what to do until then, ask your doctor or other care provider. Changed by: Felix Pacini   amphetamine-dextroamphetamine 20 MG tablet Commonly known as: Adderall One tab PO in the morning and 1/2 tab in the afternoon Start taking on: September 26, 2023 What changed: These instructions start on September 26, 2023. If you are unsure what to do until then, ask your doctor or other care provider. Changed by: Felix Pacini   fluticasone 50 MCG/ACT nasal spray Commonly known as: FLONASE Place into both nostrils daily.   loratadine 10 MG tablet Commonly known as: CLARITIN Take 10 mg by mouth daily.   PATADAY OP Apply to eye.        All past medical history, surgical history, allergies, family history, immunizations andmedications were updated in the EMR today and reviewed under the history and medication portions of their EMR.      ROS 14 pt review of systems performed and negative (unless mentioned in an HPI)  Objective: BP 134/84   Pulse 94   Temp 97.8 F (36.6 C)   Wt 147 lb 3.2 oz (66.8 kg)   LMP 08/07/2023   SpO2 99%   BMI 25.67 kg/m  Physical Exam Vitals and nursing note reviewed.  Constitutional:      General: She is not in acute distress.    Appearance: Normal appearance. She is not toxic-appearing.  HENT:     Head: Normocephalic and atraumatic.  Eyes:     General: No scleral icterus.       Right eye: No discharge.        Left eye: No discharge.     Conjunctiva/sclera: Conjunctivae normal.  Pulmonary:     Effort: Pulmonary effort is normal.  Musculoskeletal:      Right lower leg: No edema.     Left lower leg: No edema.  Skin:    Findings: No rash.  Neurological:     Mental Status: She is alert and oriented to person, place, and time. Mental status is at baseline.  Psychiatric:        Mood and Affect: Mood normal.        Behavior: Behavior normal.        Thought Content: Thought content normal.        Judgment: Judgment normal.      No results found.  Assessment/plan: April Bernard is a 37 y.o. female present for Chronic Conditions/illness Management Attention deficit disorder (ADD) without hyperactivity/Controlled  substance agreement signed Stable Continue Adderall 20/10 mg daily Indication: ADD Medication and dose: Adderall 20 mg QD # pills per month: 45 contract signed (Y/N): updated 06/01/2022 Date narcotic database last reviewed (include red flags):08/07/23 - follow up 3 mos on ADD  Influenza vaccine administered today  Return in about 11 weeks (around 10/23/2023) for Routine chronic condition follow-up.  No orders of the defined types were placed in this encounter.   Meds ordered this encounter  Medications   amphetamine-dextroamphetamine (ADDERALL) 20 MG tablet    Sig: 1 tab in the morning and 1/2 tab in the afternoon.    Dispense:  45 tablet    Refill:  0   amphetamine-dextroamphetamine (ADDERALL) 20 MG tablet    Sig: One tab PO in the morning and 1/2 tab in the afternoon    Dispense:  45 tablet    Refill:  0   amphetamine-dextroamphetamine (ADDERALL) 20 MG tablet    Sig: One tab PO in the morning and 1/2 tab in the afternoon    Dispense:  45 tablet    Refill:  0   Referral Orders  No referral(s) requested today     Electronically signed by: Felix Pacini, DO [facility] [place]

## 2023-08-09 NOTE — Addendum Note (Signed)
Addended by: Filomena Jungling on: 08/09/2023 08:58 AM   Modules accepted: Orders

## 2023-08-23 ENCOUNTER — Ambulatory Visit: Payer: Commercial Managed Care - PPO | Admitting: Family Medicine

## 2023-08-23 ENCOUNTER — Encounter: Payer: Self-pay | Admitting: Family Medicine

## 2023-08-23 VITALS — BP 102/78 | HR 71 | Temp 98.2°F | Ht 64.37 in | Wt 147.2 lb

## 2023-08-23 DIAGNOSIS — Z0000 Encounter for general adult medical examination without abnormal findings: Secondary | ICD-10-CM | POA: Diagnosis not present

## 2023-08-23 DIAGNOSIS — Z131 Encounter for screening for diabetes mellitus: Secondary | ICD-10-CM

## 2023-08-23 DIAGNOSIS — E559 Vitamin D deficiency, unspecified: Secondary | ICD-10-CM

## 2023-08-23 DIAGNOSIS — E785 Hyperlipidemia, unspecified: Secondary | ICD-10-CM | POA: Diagnosis not present

## 2023-08-23 LAB — COMPREHENSIVE METABOLIC PANEL
ALT: 19 U/L (ref 0–35)
AST: 19 U/L (ref 0–37)
Albumin: 4.3 g/dL (ref 3.5–5.2)
Alkaline Phosphatase: 48 U/L (ref 39–117)
BUN: 14 mg/dL (ref 6–23)
CO2: 29 meq/L (ref 19–32)
Calcium: 9.4 mg/dL (ref 8.4–10.5)
Chloride: 103 meq/L (ref 96–112)
Creatinine, Ser: 0.77 mg/dL (ref 0.40–1.20)
GFR: 97.68 mL/min (ref >60.00)
Glucose, Bld: 79 mg/dL (ref 70–99)
Potassium: 4 meq/L (ref 3.5–5.1)
Sodium: 137 meq/L (ref 135–145)
Total Bilirubin: 0.5 mg/dL (ref 0.2–1.2)
Total Protein: 6.7 g/dL (ref 6.0–8.3)

## 2023-08-23 LAB — HEMOGLOBIN A1C: Hgb A1c MFr Bld: 5.4 % (ref 4.6–6.5)

## 2023-08-23 LAB — VITAMIN D 25 HYDROXY (VIT D DEFICIENCY, FRACTURES): VITD: 29.85 ng/mL — ABNORMAL LOW (ref 30.00–100.00)

## 2023-08-23 LAB — LIPID PANEL
Cholesterol: 169 mg/dL (ref 0–200)
HDL: 65.3 mg/dL (ref >39.00)
LDL Cholesterol: 92 mg/dL (ref 0–99)
NonHDL: 103.99
Total CHOL/HDL Ratio: 3
Triglycerides: 58 mg/dL (ref 0.0–149.0)
VLDL: 11.6 mg/dL (ref 0.0–40.0)

## 2023-08-23 LAB — CBC
HCT: 39.2 % (ref 36.0–46.0)
Hemoglobin: 12.6 g/dL (ref 12.0–15.0)
MCHC: 32 g/dL (ref 30.0–36.0)
MCV: 90.6 fL (ref 78.0–100.0)
Platelets: 252 10*3/uL (ref 150.0–400.0)
RBC: 4.33 Mil/uL (ref 3.87–5.11)
RDW: 13.6 % (ref 11.5–15.5)
WBC: 6.1 10*3/uL (ref 4.0–10.5)

## 2023-08-23 LAB — TSH: TSH: 0.77 u[IU]/mL (ref 0.35–5.50)

## 2023-08-23 NOTE — Patient Instructions (Addendum)
Return in about 1 year (around 08/23/2024) for cpe (20 min).        Great to see you today.  I have refilled the medication(s) we provide.   If labs were collected or images ordered, we will inform you of  results once we have received them and reviewed. We will contact you either by echart message, or telephone call.  Please give ample time to the testing facility, and our office to run,  receive and review results. Please do not call inquiring of results, even if you can see them in your chart. We will contact you as soon as we are able. If it has been over 1 week since the test was completed, and you have not yet heard from [place], then please call us.    - echart message- for normal results that have been seen by the patient already.   - telephone call: abnormal results or if patient has not viewed results in their echart.  If a referral to a specialist was entered for you, please call us in 2 weeks if you have not heard from the specialist office to schedule.

## 2023-08-23 NOTE — Progress Notes (Signed)
Patient ID: [name], female  DOB: Aug 09, 1986, 37 y.o.   MRN: [id number] Patient Care Team    Relationship Specialty Notifications Start End  Natalia Leatherwood, DO PCP - General Family Medicine  06/06/18   Olivia Mackie, MD Consulting Physician Obstetrics and Gynecology  05/15/20     Chief Complaint  Patient presents with   Annual Exam    Pt is fasting;     Subjective: Maham Durrer is a 36 y.o.  Female  present for CPE All past medical history, surgical history, allergies, family history, immunizations, medications and social history were updated in the electronic medical record today. All recent labs, ED visits and hospitalizations within the last year were reviewed.  Health maintenance:  Colon cancer screen: no fhx- routine screen 44. Mom precancer polyps.  Mammogram: FHx, PGM. Routine screen 39  Cervical cancer screening: last pap: 2021, completed by: (Dr. Rosemary Holms) Immunizations: tdap 2021 UTD, Influenza UTD 2024(encouraged yearly), covid x3 Infectious disease screening: HIV  and hep c completed DEXA: routine screen Assistive device: none Oxygen VQQ:VZDG Patient has a Dental home. Hospitalizations/ED visits: reviewed      08/23/2023    9:37 AM 08/07/2023   11:06 AM 05/05/2023    8:45 AM 12/29/2022    9:11 AM 10/13/2022    9:42 AM  Depression screen PHQ 2/9  Decreased Interest 0 0 0 0 0  Down, Depressed, Hopeless 0 1 0 0 0  PHQ - 2 Score 0 1 0 0 0  Altered sleeping 0      Tired, decreased energy 2      Change in appetite 1      Feeling bad or failure about yourself  0      Trouble concentrating 0      Moving slowly or fidgety/restless 0      Suicidal thoughts 0      PHQ-9 Score 3      Difficult doing work/chores Somewhat difficult        Immunization History  Administered Date(s) Administered   Influenza, Seasonal, Injecte, Preservative Fre 08/07/2023   Influenza,inj,Quad PF,6+ Mos 09/19/2017, 09/01/2018   Influenza-Unspecified 08/24/2015, 08/23/2020    PFIZER(Purple Top)SARS-COV-2 Vaccination 01/05/2020, 02/05/2020, 08/23/2020   Tdap 05/08/2014, 12/02/2019   Past Medical History:  Diagnosis Date   Acute cholecystitis 07/27/2020   ADD (attention deficit disorder)    Allergic rhinitis    Chicken pox    Cholelithiases 06/26/2020   Depression    Ganglion cyst of right foot    History of pregnancy induced hypertension    Preeclampsia    2018   Vaginal Pap smear, abnormal    No Known Allergies Past Surgical History:  Procedure Laterality Date   CESAREAN SECTION  11/08/2016   CESAREAN SECTION N/A 03/04/2020   Procedure: Repeat CESAREAN SECTION;  Surgeon: Olivia Mackie, MD;  Location: [facility];  Service: Obstetrics;  Laterality: N/A;  EDD: 03/10/20   CHOLECYSTECTOMY N/A 07/27/2020   Procedure: LAPAROSCOPIC CHOLECYSTECTOMY WITH POSSIBLE  INTRAOPERATIVE CHOLANGIOGRAM;  Surgeon: Luretha Murphy, MD;  Location: [facility];  Service: General;  Laterality: N/A;   REFRACTIVE SURGERY Bilateral 2011   Family History  Problem Relation Age of Onset   Arthritis Mother    Hypertension Mother    Colon polyps Mother        precancerous.   Hyperlipidemia Father    Arthritis Maternal Grandmother    Hypertension Maternal Grandmother    Hypertension Maternal Grandfather    Heart disease Maternal Grandfather  Diabetes Maternal Grandfather    Hypertension Paternal Grandmother    Hyperlipidemia Paternal Grandmother    Breast cancer Paternal Grandmother 45   Hypertension Paternal Grandfather    Hyperlipidemia Paternal Grandfather    Prostate cancer Paternal Grandfather    Social History   Social History Narrative   Marital status/children/pets: married, 1 child.    Education/employment: B.S. Degree, works in [employer].   Safety:      -Wears a bicycle helmet riding a bike: No     -smoke alarm in the home:Yes     - wears seatbelt: Yes     - Feels safe in their relationships: Yes    Allergies as of 08/23/2023   No Known Allergies       Medication List        Accurate as of August 23, 2023  9:51 AM. If you have any questions, ask your nurse or doctor.          amphetamine-dextroamphetamine 20 MG tablet Commonly known as: ADDERALL 1 tab in the morning and 1/2 tab in the afternoon.   amphetamine-dextroamphetamine 20 MG tablet Commonly known as: Adderall One tab PO in the morning and 1/2 tab in the afternoon Start taking on: August 31, 2023   amphetamine-dextroamphetamine 20 MG tablet Commonly known as: Adderall One tab PO in the morning and 1/2 tab in the afternoon Start taking on: September 26, 2023   fluticasone 50 MCG/ACT nasal spray Commonly known as: FLONASE Place into both nostrils daily.   loratadine 10 MG tablet Commonly known as: CLARITIN Take 10 mg by mouth daily.   PATADAY OP Apply to eye.        All past medical history, surgical history, allergies, family history, immunizations andmedications were updated in the EMR today and reviewed under the history and medication portions of their EMR.     No results found for this or any previous visit (from the past 2160 hour(s)).   ROS 14 pt review of systems performed and negative (unless mentioned in an HPI)  Objective: BP 102/78   Pulse 71   Temp 98.2 F (36.8 C)   Ht 5' 4.37" (1.635 m)   Wt 147 lb 3.2 oz (66.8 kg)   LMP 08/07/2023   SpO2 99%   BMI 24.98 kg/m  Physical Exam Vitals and nursing note reviewed.  Constitutional:      General: She is not in acute distress.    Appearance: Normal appearance. She is obese. She is not ill-appearing or toxic-appearing.  HENT:     Head: Normocephalic and atraumatic.     Right Ear: Tympanic membrane, ear canal and external ear normal. There is no impacted cerumen.     Left Ear: Tympanic membrane, ear canal and external ear normal. There is no impacted cerumen.     Nose: No congestion or rhinorrhea.     Mouth/Throat:     Mouth: Mucous membranes are moist.     Pharynx: Oropharynx is  clear. No oropharyngeal exudate or posterior oropharyngeal erythema.  Eyes:     General: No scleral icterus.       Right eye: No discharge.        Left eye: No discharge.     Extraocular Movements: Extraocular movements intact.     Conjunctiva/sclera: Conjunctivae normal.     Pupils: Pupils are equal, round, and reactive to light.  Cardiovascular:     Rate and Rhythm: Normal rate and regular rhythm.     Pulses: Normal pulses.  Heart sounds: Normal heart sounds. No murmur heard.    No friction rub. No gallop.  Pulmonary:     Effort: Pulmonary effort is normal. No respiratory distress.     Breath sounds: Normal breath sounds. No stridor. No wheezing, rhonchi or rales.  Chest:     Chest wall: No tenderness.  Abdominal:     General: Abdomen is flat. Bowel sounds are normal. There is no distension.     Palpations: Abdomen is soft. There is no mass.     Tenderness: There is no abdominal tenderness. There is no right CVA tenderness, left CVA tenderness, guarding or rebound.     Hernia: No hernia is present.  Musculoskeletal:        General: No swelling, tenderness or deformity. Normal range of motion.     Cervical back: Normal range of motion and neck supple. No rigidity or tenderness.     Right lower leg: No edema.     Left lower leg: No edema.  Lymphadenopathy:     Cervical: No cervical adenopathy.  Skin:    General: Skin is warm and dry.     Coloration: Skin is not jaundiced or pale.     Findings: No bruising, erythema, lesion or rash.  Neurological:     General: No focal deficit present.     Mental Status: She is alert and oriented to person, place, and time. Mental status is at baseline.     Cranial Nerves: No cranial nerve deficit.     Sensory: No sensory deficit.     Motor: No weakness.     Coordination: Coordination normal.     Gait: Gait normal.     Deep Tendon Reflexes: Reflexes normal.  Psychiatric:        Mood and Affect: Mood normal.        Behavior: Behavior  normal.        Thought Content: Thought content normal.        Judgment: Judgment normal.      No results found.  Assessment/plan: Prima Stoves is a 37 y.o. female present for CPE Vitamin D deficiency - VITAMIN D 25 Hydroxy (Vit-D Deficiency, Fractures) collected today Continue supplement Hyperlipidemia LDL goal <130 Lipids collected today Routine general medical examination at a health care facility Colonoscopy: no fhx- routine screen 44. Mammogram: FHx, PGM. Routine screen 40  Cervical cancer screening: last pap: 2021, completed by: (Dr. Rosemary Holms) Immunizations: tdap 2021 UTD, Influenza UTD 2024(encouraged yearly), covid x3 Infectious disease screening: HIV  and hep c completed DEXA: routine screen Patient was encouraged to exercise greater than 150 minutes a week. Patient was encouraged to choose a diet filled with fresh fruits and vegetables, and lean meats. AVS provided to patient today for education/recommendation on gender specific health and safety maintenance.  Return in about 1 year (around 08/23/2024) for cpe (20 min).  Orders Placed This Encounter  Procedures   CBC   Comprehensive metabolic panel   Hemoglobin A1c   TSH   Lipid panel   Vitamin D (25 hydroxy)    No orders of the defined types were placed in this encounter.  Referral Orders  No referral(s) requested today     Electronically signed by: Felix Pacini, DO [facility] [place]

## 2023-11-05 ENCOUNTER — Encounter: Payer: Self-pay | Admitting: Family Medicine

## 2023-11-06 ENCOUNTER — Ambulatory Visit: Payer: Commercial Managed Care - PPO | Admitting: Family Medicine

## 2023-11-06 NOTE — Telephone Encounter (Signed)
 Pt rescheduled

## 2023-11-07 ENCOUNTER — Encounter: Payer: Self-pay | Admitting: Family Medicine

## 2023-11-07 ENCOUNTER — Telehealth: Payer: Commercial Managed Care - PPO | Admitting: Family Medicine

## 2023-11-07 VITALS — HR 77 | Wt 142.0 lb

## 2023-11-07 DIAGNOSIS — F988 Other specified behavioral and emotional disorders with onset usually occurring in childhood and adolescence: Secondary | ICD-10-CM

## 2023-11-07 DIAGNOSIS — Z79899 Other long term (current) drug therapy: Secondary | ICD-10-CM

## 2023-11-07 MED ORDER — AMPHETAMINE-DEXTROAMPHETAMINE 20 MG PO TABS: ORAL_TABLET | ORAL | 0 refills | Status: DC

## 2023-11-07 NOTE — Progress Notes (Signed)
 VIRTUAL VISIT VIA VIDEO  I connected with April Bernard on 11/07/23 at  2:20 PM EST by a video enabled telemedicine application and verified that I am speaking with the correct person using two identifiers. Location patient: Home Location provider: [facility], Office Persons participating in the virtual visit: Patient, Dr. Catherine and ALONSO Sharps, CMA  I discussed the limitations of evaluation and management by telemedicine and the availability of in person appointments. The patient expressed understanding and agreed to proceed.    Patient ID: April Bernard, female  DOB: August 23, 1986, 38 y.o.   MRN: [id number] Patient Care Team    Relationship Specialty Notifications Start End  Catherine Charlies LABOR, DO PCP - General Family Medicine  06/06/18   Gorge Ade, MD Consulting Physician Obstetrics and Gynecology  05/15/20      Chief Complaint  Patient presents with   ADD    Subjective: April Bernard is a 38 y.o.  Female  present for Chronic Conditions/illness Management  All past medical history, surgical history, allergies, family history, immunizations, medications and social history were updated in the electronic medical record today. All recent labs, ED visits and hospitalizations within the last year were reviewed.  ADD/controlled substance agreement in place:  Pt reports she was diagnosed with ADD in middle school.  She has been prescribed Adderall off and on over the years, with discontinuation in high school, when she lost insurance  She was established with a psychiatrist in 2013 that restarted the medication for her.   She reports Adderall ER 20 mg daily is very helpful. Patient is compliant with prescription.  She denies negative SE. Indication: ADD Medication and dose: Adderall  20/10 mg QD # pills per month: 45 contract signed (Y/N): Y Date narcotic database last reviewed (include red flags): 11/07/23      08/23/2023    9:37 AM 08/07/2023   11:06 AM  05/05/2023    8:45 AM 12/29/2022    9:11 AM 10/13/2022    9:42 AM  Depression screen PHQ 2/9  Decreased Interest 0 0 0 0 0  Down, Depressed, Hopeless 0 1 0 0 0  PHQ - 2 Score 0 1 0 0 0  Altered sleeping 0      Tired, decreased energy 2      Change in appetite 1      Feeling bad or failure about yourself  0      Trouble concentrating 0      Moving slowly or fidgety/restless 0      Suicidal thoughts 0      PHQ-9 Score 3      Difficult doing work/chores Somewhat difficult        Immunization History  Administered Date(s) Administered   Influenza, Seasonal, Injecte, Preservative Fre 08/07/2023   Influenza,inj,Quad PF,6+ Mos 09/19/2017, 09/01/2018   Influenza-Unspecified 08/24/2015, 08/23/2020   PFIZER(Purple Top)SARS-COV-2 Vaccination 01/05/2020, 02/05/2020, 08/23/2020   Tdap 05/08/2014, 12/02/2019   Past Medical History:  Diagnosis Date   Acute cholecystitis 07/27/2020   ADD (attention deficit disorder)    Allergic rhinitis    Chicken pox    Cholelithiases 06/26/2020   Depression    Ganglion cyst of right foot    History of pregnancy induced hypertension    Preeclampsia    2018   Vaginal Pap smear, abnormal    No Known Allergies Past Surgical History:  Procedure Laterality Date   CESAREAN SECTION  11/08/2016   CESAREAN SECTION N/A 03/04/2020   Procedure: Repeat CESAREAN  SECTION;  Surgeon: Gorge Ade, MD;  Location: [facility];  Service: Obstetrics;  Laterality: N/A;  EDD: 03/10/20   CHOLECYSTECTOMY N/A 07/27/2020   Procedure: LAPAROSCOPIC CHOLECYSTECTOMY WITH POSSIBLE  INTRAOPERATIVE CHOLANGIOGRAM;  Surgeon: Gladis Cough, MD;  Location: [facility];  Service: General;  Laterality: N/A;   REFRACTIVE SURGERY Bilateral 2011   Family History  Problem Relation Age of Onset   Arthritis Mother    Hypertension Mother    Colon polyps Mother        precancerous.   Hyperlipidemia Father    Arthritis Maternal Grandmother    Hypertension Maternal Grandmother    Hypertension  Maternal Grandfather    Heart disease Maternal Grandfather    Diabetes Maternal Grandfather    Hypertension Paternal Grandmother    Hyperlipidemia Paternal Grandmother    Breast cancer Paternal Grandmother 48   Hypertension Paternal Grandfather    Hyperlipidemia Paternal Grandfather    Prostate cancer Paternal Grandfather    Social History   Social History Narrative   Marital status/children/pets: married, 1 child.    Education/employment: B.S. Degree, works in [employer].   Safety:      -Wears a bicycle helmet riding a bike: No     -smoke alarm in the home:Yes     - wears seatbelt: Yes     - Feels safe in their relationships: Yes    Allergies as of 11/07/2023   No Known Allergies      Medication List        Accurate as of November 07, 2023  2:15 PM. If you have any questions, ask your nurse or doctor.          amphetamine -dextroamphetamine  20 MG tablet Commonly known as: ADDERALL 1 tab in the morning and 1/2 tab in the afternoon. What changed: Another medication with the same name was changed. Make sure you understand how and when to take each. Changed by: Charlies Bellini   amphetamine -dextroamphetamine  20 MG tablet Commonly known as: Adderall One tab PO in the morning and 1/2 tab in the afternoon Start taking on: November 28, 2023 What changed: These instructions start on November 28, 2023. If you are unsure what to do until then, ask your doctor or other care provider. Changed by: Charlies Bellini   amphetamine -dextroamphetamine  20 MG tablet Commonly known as: Adderall One tab PO in the morning and 1/2 tab in the afternoon Start taking on: December 18, 2023 What changed: These instructions start on December 18, 2023. If you are unsure what to do until then, ask your doctor or other care provider. Changed by: Charlies Bellini   fluticasone 50 MCG/ACT nasal spray Commonly known as: FLONASE Place into both nostrils daily.   loratadine 10 MG tablet Commonly known as:  CLARITIN Take 10 mg by mouth daily.   PATADAY OP Apply to eye.        All past medical history, surgical history, allergies, family history, immunizations andmedications were updated in the EMR today and reviewed under the history and medication portions of their EMR.      ROS 14 pt review of systems performed and negative (unless mentioned in an HPI)  Objective: Pulse 77   Wt 142 lb (64.4 kg)   BMI 24.09 kg/m  Physical Exam Vitals and nursing note reviewed.  Constitutional:      General: She is not in acute distress.    Appearance: Normal appearance. She is not toxic-appearing.  HENT:     Head: Normocephalic and atraumatic.  Eyes:  General: No scleral icterus.       Right eye: No discharge.        Left eye: No discharge.     Conjunctiva/sclera: Conjunctivae normal.  Pulmonary:     Effort: Pulmonary effort is normal.  Musculoskeletal:     Right lower leg: No edema.     Left lower leg: No edema.  Skin:    Findings: No rash.  Neurological:     Mental Status: She is alert and oriented to person, place, and time. Mental status is at baseline.  Psychiatric:        Mood and Affect: Mood normal.        Behavior: Behavior normal.        Thought Content: Thought content normal.        Judgment: Judgment normal.      No results found.  Assessment/plan: April Bernard is a 38 y.o. female present for Chronic Conditions/illness Management Attention deficit disorder (ADD) without hyperactivity/Controlled substance agreement signed Stable Continue Adderall 20/10 mg daily Indication: ADD Medication and dose: Adderall 20 mg QD # pills per month: 45 contract signed (Y/N): updated 06/01/2022 Date narcotic database last reviewed (include red flags):11/07/23 - follow up 3 mos on ADD   Return in about 11 weeks (around 01/23/2024) for Routine chronic condition follow-up.  No orders of the defined types were placed in this encounter.   Meds ordered this encounter   Medications   amphetamine -dextroamphetamine  (ADDERALL) 20 MG tablet    Sig: 1 tab in the morning and 1/2 tab in the afternoon.    Dispense:  45 tablet    Refill:  0   amphetamine -dextroamphetamine  (ADDERALL) 20 MG tablet    Sig: One tab PO in the morning and 1/2 tab in the afternoon    Dispense:  45 tablet    Refill:  0   amphetamine -dextroamphetamine  (ADDERALL) 20 MG tablet    Sig: One tab PO in the morning and 1/2 tab in the afternoon    Dispense:  45 tablet    Refill:  0   Referral Orders  No referral(s) requested today     Electronically signed by: Charlies Bellini, DO [facility] [place]

## 2023-11-07 NOTE — Patient Instructions (Addendum)
 Return in about 11 weeks (around 01/23/2024) for Routine chronic condition follow-up.        Great to see you today.  I have refilled the medication(s) we provide.   If labs were collected or images ordered, we will inform you of  results once we have received them and reviewed. We will contact you either by echart message, or telephone call.  Please give ample time to the testing facility, and our office to run,  receive and review results. Please do not call inquiring of results, even if you can see them in your chart. We will contact you as soon as we are able. If it has been over 1 week since the test was completed, and you have not yet heard from us , then please call us .    - echart message- for normal results that have been seen by the patient already.   - telephone call: abnormal results or if patient has not viewed results in their echart.  If a referral to a specialist was entered for you, please call us  in 2 weeks if you have not heard from the specialist office to schedule.

## 2024-01-10 ENCOUNTER — Encounter: Payer: Self-pay | Admitting: Family Medicine

## 2024-01-10 ENCOUNTER — Telehealth: Payer: Self-pay | Admitting: Family Medicine

## 2024-01-10 MED ORDER — AMPHETAMINE-DEXTROAMPHETAMINE 10 MG PO TABS: ORAL_TABLET | ORAL | 0 refills | Status: DC

## 2024-01-10 NOTE — Telephone Encounter (Signed)
 See phone note

## 2024-01-10 NOTE — Telephone Encounter (Signed)
 Pt made aware. Pt scheduled for 3/31

## 2024-01-10 NOTE — Telephone Encounter (Signed)
 April Bernard

## 2024-01-10 NOTE — Telephone Encounter (Signed)
 Changed her adderall to 10 mg tabs (2 tabs morning and 1 tab afternoon) 30day script- due to pharmacy being out of stock of 20 mg tabs.   Follow due prior to other refills.

## 2024-01-29 ENCOUNTER — Ambulatory Visit: Admitting: Family Medicine

## 2024-01-29 VITALS — BP 108/72 | HR 77 | Temp 98.1°F | Wt 135.0 lb

## 2024-01-29 DIAGNOSIS — F988 Other specified behavioral and emotional disorders with onset usually occurring in childhood and adolescence: Secondary | ICD-10-CM

## 2024-01-29 DIAGNOSIS — Z79899 Other long term (current) drug therapy: Secondary | ICD-10-CM

## 2024-01-29 MED ORDER — AMPHETAMINE-DEXTROAMPHETAMINE 20 MG PO TABS: ORAL_TABLET | ORAL | 0 refills | Status: DC

## 2024-01-29 NOTE — Progress Notes (Signed)
 Patient ID: April Bernard, female  DOB: 05/23/1986, 38 y.o.   MRN: [id number] Patient Care Team    Relationship Specialty Notifications Start End  Natalia Leatherwood, DO PCP - General Family Medicine  06/06/18   Olivia Mackie, MD Consulting Physician Obstetrics and Gynecology  05/15/20      Chief Complaint  Patient presents with   ADD    Subjective: April Bernard is a 38 y.o.  Female 38 y.o. present for Chronic Conditions/illness Management  All past medical history, surgical history, allergies, family history, immunizations, medications and social history were updated in the electronic medical record today. All recent labs, ED visits and hospitalizations within the last year were reviewed.  ADD/controlled substance agreement in place:  Pt reports she was diagnosed with ADD in middle school.  She has been prescribed Adderall off and on over the years, with discontinuation in high school, when she lost insurance  She was established with a psychiatrist in 2013 that restarted the medication for her.   She reports Adderall ER 20 mg daily is helpful Patient is compliant with prescription.  She denies negative SE. Indication: ADD Medication and dose: Adderall  20/10 mg QD # pills per month: 45 contract signed (Y/N): Y Date narcotic database last reviewed (include red flags): 01/29/24      01/29/2024   10:26 AM 08/23/2023    9:37 AM 08/07/2023   11:06 AM 05/05/2023    8:45 AM 12/29/2022    9:11 AM  Depression screen PHQ 2/9  Decreased Interest 0 0 0 0 0  Down, Depressed, Hopeless 0 0 1 0 0  PHQ - 2 Score 0 0 1 0 0  Altered sleeping 0 0     Tired, decreased energy 1 2     Change in appetite 2 1     Feeling bad or failure about yourself  0 0     Trouble concentrating 1 0     Moving slowly or fidgety/restless 0 0     Suicidal thoughts 0 0     PHQ-9 Score 4 3     Difficult doing work/chores Not difficult at all Somewhat difficult       Immunization History  Administered  Date(s) Administered   Influenza, Seasonal, Injecte, Preservative Fre 08/07/2023   Influenza,inj,Quad PF,6+ Mos 09/19/2017, 09/01/2018   Influenza-Unspecified 08/24/2015, 08/23/2020   PFIZER(Purple Top)SARS-COV-2 Vaccination 01/05/2020, 02/05/2020, 08/23/2020   Tdap 05/08/2014, 12/02/2019   Past Medical History:  Diagnosis Date   Acute cholecystitis 07/27/2020   ADD (attention deficit disorder)    Allergic rhinitis    Chicken pox    Cholelithiases 06/26/2020   Depression    Ganglion cyst of right foot    History of pregnancy induced hypertension    Preeclampsia    2018   Vaginal Pap smear, abnormal    No Known Allergies Past Surgical History:  Procedure Laterality Date   CESAREAN SECTION  11/08/2016   CESAREAN SECTION N/A 03/04/2020   Procedure: Repeat CESAREAN SECTION;  Surgeon: Olivia Mackie, MD;  Location: [facility];  Service: Obstetrics;  Laterality: N/A;  EDD: 03/10/20   CHOLECYSTECTOMY N/A 07/27/2020   Procedure: LAPAROSCOPIC CHOLECYSTECTOMY WITH POSSIBLE  INTRAOPERATIVE CHOLANGIOGRAM;  Surgeon: Luretha Murphy, MD;  Location: [facility];  Service: General;  Laterality: N/A;   REFRACTIVE SURGERY Bilateral 2011   Family History  Problem Relation Age of Onset   Arthritis Mother    Hypertension Mother    Colon polyps Mother  precancerous.   Hyperlipidemia Father    Arthritis Maternal Grandmother    Hypertension Maternal Grandmother    Hypertension Maternal Grandfather    Heart disease Maternal Grandfather    Diabetes Maternal Grandfather    Hypertension Paternal Grandmother    Hyperlipidemia Paternal Grandmother    Breast cancer Paternal Grandmother 34   Hypertension Paternal Grandfather    Hyperlipidemia Paternal Grandfather    Prostate cancer Paternal Grandfather    Social History   Social History Narrative   Marital status/children/pets: married, 1 child.    Education/employment: B.S. Degree, works in [employer].   Safety:      -Wears a bicycle helmet  riding a bike: No     -smoke alarm in the home:Yes     - wears seatbelt: Yes     - Feels safe in their relationships: Yes    Allergies as of 01/29/2024   No Known Allergies      Medication List        Accurate as of January 29, 2024 10:37 AM. If you have any questions, ask your nurse or doctor.          STOP taking these medications    amphetamine-dextroamphetamine 10 MG tablet Commonly known as: ADDERALL Replaced by: amphetamine-dextroamphetamine 20 MG tablet You also have another medication with the same name that you need to continue taking as instructed. Stopped by: Felix Pacini       TAKE these medications    amphetamine-dextroamphetamine 20 MG tablet Commonly known as: Adderall One tab PO in the morning and 1/2 tab in the afternoon What changed:  Another medication with the same name was added. Make sure you understand how and when to take each. Another medication with the same name was changed. Make sure you understand how and when to take each. Another medication with the same name was removed. Continue taking this medication, and follow the directions you see here. Changed by: Felix Pacini   amphetamine-dextroamphetamine 20 MG tablet Commonly known as: Adderall One tab PO in the morning and 1/2 tab in the afternoon Start taking on: February 21, 2024 What changed:  These instructions start on February 21, 2024. If you are unsure what to do until then, ask your doctor or other care provider. Another medication with the same name was removed. Continue taking this medication, and follow the directions you see here. Changed by: Felix Pacini   amphetamine-dextroamphetamine 20 MG tablet Commonly known as: Adderall One tab PO in the morning and 1/2 tab in the afternoon Start taking on: Mar 14, 2024 What changed: You were already taking a medication with the same name, and this prescription was added. Make sure you understand how and when to take each. Replaces:  amphetamine-dextroamphetamine 10 MG tablet Changed by: Felix Pacini   fluticasone 50 MCG/ACT nasal spray Commonly known as: FLONASE Place into both nostrils daily.   loratadine 10 MG tablet Commonly known as: CLARITIN Take 10 mg by mouth daily.   PATADAY OP Apply to eye.        All past medical history, surgical history, allergies, family history, immunizations andmedications were updated in the EMR today and reviewed under the history and medication portions of their EMR.      ROS 14 pt review of systems performed and negative (unless mentioned in an HPI)  Objective: BP 108/72   Pulse 77   Temp 98.1 F (36.7 C)   Wt 135 lb (61.2 kg)   SpO2 98%   BMI 22.91  kg/m  Physical Exam Vitals and nursing note reviewed.  Constitutional:      General: She is not in acute distress.    Appearance: Normal appearance. She is not toxic-appearing.  HENT:     Head: Normocephalic and atraumatic.  Eyes:     General: No scleral icterus.       Right eye: No discharge.        Left eye: No discharge.     Conjunctiva/sclera: Conjunctivae normal.  Pulmonary:     Effort: Pulmonary effort is normal.  Musculoskeletal:     Right lower leg: No edema.     Left lower leg: No edema.  Skin:    Findings: No rash.  Neurological:     Mental Status: She is alert and oriented to person, place, and time. Mental status is at baseline.  Psychiatric:        Mood and Affect: Mood normal.        Behavior: Behavior normal.        Thought Content: Thought content normal.        Judgment: Judgment normal.      No results found.  Assessment/plan: April Bernard is a 38 y.o. female present for Chronic Conditions/illness Management Attention deficit disorder (ADD) without hyperactivity/Controlled substance agreement signed Stable Continue  Adderall 20/10 mg daily Indication: ADD Medication and dose: Adderall 20 mg QD # pills per month: 45 contract signed (Y/N): updated 06/01/2022 Date narcotic  database last reviewed (include red flags):01/29/24 - follow up 3 mos on ADD   Return in about 11 weeks (around 04/15/2024) for Routine chronic condition follow-up.  No orders of the defined types were placed in this encounter.   Meds ordered this encounter  Medications   amphetamine-dextroamphetamine (ADDERALL) 20 MG tablet    Sig: One tab PO in the morning and 1/2 tab in the afternoon    Dispense:  45 tablet    Refill:  0   amphetamine-dextroamphetamine (ADDERALL) 20 MG tablet    Sig: One tab PO in the morning and 1/2 tab in the afternoon    Dispense:  45 tablet    Refill:  0   amphetamine-dextroamphetamine (ADDERALL) 20 MG tablet    Sig: One tab PO in the morning and 1/2 tab in the afternoon    Dispense:  45 tablet    Refill:  0   Referral Orders  No referral(s) requested today     Electronically signed by: Felix Pacini, DO [facility] [place]

## 2024-01-29 NOTE — Patient Instructions (Addendum)

## 2024-04-16 ENCOUNTER — Ambulatory Visit (INDEPENDENT_AMBULATORY_CARE_PROVIDER_SITE_OTHER): Admitting: Family Medicine

## 2024-04-16 DIAGNOSIS — Z79899 Other long term (current) drug therapy: Secondary | ICD-10-CM

## 2024-04-16 DIAGNOSIS — E559 Vitamin D deficiency, unspecified: Secondary | ICD-10-CM

## 2024-04-16 DIAGNOSIS — Z91199 Patient's noncompliance with other medical treatment and regimen due to unspecified reason: Secondary | ICD-10-CM

## 2024-04-16 DIAGNOSIS — E785 Hyperlipidemia, unspecified: Secondary | ICD-10-CM

## 2024-04-16 DIAGNOSIS — F988 Other specified behavioral and emotional disorders with onset usually occurring in childhood and adolescence: Secondary | ICD-10-CM

## 2024-04-16 NOTE — Progress Notes (Signed)
   Same day cancel- Monday am appt.

## 2024-04-16 NOTE — Patient Instructions (Signed)

## 2024-05-24 ENCOUNTER — Ambulatory Visit: Payer: Self-pay

## 2024-05-24 NOTE — Telephone Encounter (Signed)
  FYI Only or Action Required?: Action required by provider: update on patient condition and request for medication.  Patient was last seen in primary care on 01/29/2024 by Catherine Fuller A, DO.  Called Nurse Triage reporting Back Pain.  Symptoms began yesterday.  Interventions attempted: OTC medications: ibuprofen .  Symptoms are: unchanged.  Triage Disposition: See HCP Within 4 Hours (Or PCP Triage)  Patient/caregiver understands and will follow disposition?: No, wishes to speak with PCP Copied from CRM #[id number]. Topic: Clinical - Red Word Triage >> May 24, 2024 12:18 PM Suzen RAMAN wrote: Red Word that prompted transfer to Nurse Triage: extreme back spasms/pain Reason for Disposition  [1] SEVERE back pain (e.g., excruciating, unable to do any normal activities) AND [2] not improved 2 hours after pain medicine  Answer Assessment - Initial Assessment Questions [place] away [place] nearest ED. She does not feel comfortable going there.  She requests that something be called in for her to relieve her pain until she can come home.  She requests a phone call to discuss at [phone]    1. ONSET: When did the pain begin? (e.g., minutes, hours, days)     Three days ago 2. LOCATION: Where does it hurt? (upper, mid or lower back)     Low back 3. SEVERITY: How bad is the pain?  (e.g., Scale 1-10; mild, moderate, or severe)     8/10 with back spasms, difficulty walking 4. PATTERN: Is the pain constant? (e.g., yes, no; constant, intermittent)      Worse when walking, but does not have pain at rest 5. RADIATION: Does the pain shoot into your legs or somewhere else?     denies 6. CAUSE:  What do you think is causing the back pain?      Hunched over looking for shark teeth 7. BACK OVERUSE:  Any recent lifting of heavy objects, strenuous work or exercise?     Hunched over looking for shark teeth 8. MEDICINES: What have you  taken so far for the pain? (e.g., nothing, acetaminophen , NSAIDS)     Ibuprofen , alternating ice and heat 9. NEUROLOGIC SYMPTOMS: Do you have any weakness, numbness, or problems with bowel/bladder control?     denies 10. OTHER SYMPTOMS: Do you have any other symptoms? (e.g., fever, abdomen pain, burning with urination, blood in urine)       denies  Protocols used: Back Pain-A-AH

## 2024-05-24 NOTE — Telephone Encounter (Signed)
 LM for pt to return call to discuss.

## 2024-06-20 ENCOUNTER — Ambulatory Visit: Admitting: Family Medicine

## 2024-06-20 VITALS — BP 108/72 | HR 76 | Temp 98.0°F | Wt 135.0 lb

## 2024-06-20 DIAGNOSIS — Z79899 Other long term (current) drug therapy: Secondary | ICD-10-CM | POA: Diagnosis not present

## 2024-06-20 DIAGNOSIS — F988 Other specified behavioral and emotional disorders with onset usually occurring in childhood and adolescence: Secondary | ICD-10-CM

## 2024-06-20 MED ORDER — AMPHETAMINE-DEXTROAMPHETAMINE 20 MG PO TABS: ORAL_TABLET | ORAL | 0 refills | Status: DC

## 2024-06-20 NOTE — Patient Instructions (Addendum)
         Great to see you today.  I have refilled the medication(s) we provide.   If labs were collected or images ordered, we will inform you of  results once we have received them and reviewed. We will contact you either by echart message, or telephone call.  Please give ample time to the testing facility, and our office to run,  receive and review results. Please do not call inquiring of results, even if you can see them in your chart. We will contact you as soon as we are able. If it has been over 1 week since the test was completed, and you have not yet heard from us , then please call us .    - echart message- for normal results that have been seen by the patient already.   - telephone call: abnormal results or if patient has not viewed results in their echart.  If a referral to a specialist was entered for you, please call us  in 2 weeks if you have not heard from the specialist office to schedule.

## 2024-06-20 NOTE — Progress Notes (Signed)
 AVS     Patient ID: April Bernard, female  DOB: 10-27-1986, 38 y.o.   MRN: [id number] Patient Care Team    Relationship Specialty Notifications Start End  Catherine Charlies LABOR, DO PCP - General Family Medicine  06/06/18   Gorge Ade, MD Consulting Physician Obstetrics and Gynecology  05/15/20      Chief Complaint  Patient presents with   ADD    Subjective: April Bernard is a 38 y.o.  Female  present for Chronic Conditions/illness Management  All past medical history, surgical history, allergies, family history, immunizations, medications and social history were updated in the electronic medical record today. All recent labs, ED visits and hospitalizations within the last year were reviewed.  ADD/controlled substance agreement in place:  Pt reports she was diagnosed with ADD in middle school.  She has been prescribed Adderall off and on over the years, with discontinuation in high school, when she lost insurance  She was established with a psychiatrist in 2013 that restarted the medication for her.   She reports Adderall 20/10 working well Patient is compliant with prescription.  She denies negative SE. Indication: ADD Medication and dose: Adderall  20/10 mg QD # pills per month: 45 contract signed (Y/N): Y Date narcotic database last reviewed (include red flags): 06/20/24      06/20/2024    9:11 AM 01/29/2024   10:26 AM 08/23/2023    9:37 AM 08/07/2023   11:06 AM 05/05/2023    8:45 AM  Depression screen PHQ 2/9  Decreased Interest 0 0 0 0 0  Down, Depressed, Hopeless 0 0 0 1 0  PHQ - 2 Score 0 0 0 1 0  Altered sleeping  0 0    Tired, decreased energy  1 2    Change in appetite  2 1    Feeling bad or failure about yourself   0 0    Trouble concentrating  1 0    Moving slowly or fidgety/restless  0 0    Suicidal thoughts  0 0    PHQ-9 Score  4 3    Difficult doing work/chores  Not difficult at all Somewhat difficult      Immunization History  Administered Date(s)  Administered   Influenza, Seasonal, Injecte, Preservative Fre 08/07/2023   Influenza,inj,Quad PF,6+ Mos 09/19/2017, 09/01/2018   Influenza-Unspecified 08/24/2015, 08/23/2020   PFIZER(Purple Top)SARS-COV-2 Vaccination 01/05/2020, 02/05/2020, 08/23/2020   Tdap 05/08/2014, 12/02/2019   Past Medical History:  Diagnosis Date   Acute cholecystitis 07/27/2020   ADD (attention deficit disorder)    Allergic rhinitis    Chicken pox    Cholelithiases 06/26/2020   Depression    Ganglion cyst of right foot    History of pregnancy induced hypertension    Preeclampsia    2018   Vaginal Pap smear, abnormal    No Known Allergies Past Surgical History:  Procedure Laterality Date   CESAREAN SECTION  11/08/2016   CESAREAN SECTION N/A 03/04/2020   Procedure: Repeat CESAREAN SECTION;  Surgeon: Gorge Ade, MD;  Location: [facility];  Service: Obstetrics;  Laterality: N/A;  EDD: 03/10/20   CHOLECYSTECTOMY N/A 07/27/2020   Procedure: LAPAROSCOPIC CHOLECYSTECTOMY WITH POSSIBLE  INTRAOPERATIVE CHOLANGIOGRAM;  Surgeon: Gladis Cough, MD;  Location: [facility];  Service: General;  Laterality: N/A;   REFRACTIVE SURGERY Bilateral 2011   Family History  Problem Relation Age of Onset   Arthritis Mother    Hypertension Mother    Colon polyps Mother  precancerous.   Hyperlipidemia Father    Arthritis Maternal Grandmother    Hypertension Maternal Grandmother    Hypertension Maternal Grandfather    Heart disease Maternal Grandfather    Diabetes Maternal Grandfather    Hypertension Paternal Grandmother    Hyperlipidemia Paternal Grandmother    Breast cancer Paternal Grandmother 27   Hypertension Paternal Grandfather    Hyperlipidemia Paternal Grandfather    Prostate cancer Paternal Grandfather    Social History   Social History Narrative   Marital status/children/pets: married, 1 child.    Education/employment: B.S. Degree, works in [employer].   Safety:      -Wears a bicycle helmet riding a  bike: No     -smoke alarm in the home:Yes     - wears seatbelt: Yes     - Feels safe in their relationships: Yes    Allergies as of 06/20/2024   No Known Allergies      Medication List        Accurate as of June 20, 2024  9:19 AM. If you have any questions, ask your nurse or doctor.          amphetamine -dextroamphetamine  20 MG tablet Commonly known as: Adderall One tab PO in the morning and 1/2 tab in the afternoon What changed: Another medication with the same name was changed. Make sure you understand how and when to take each. Changed by: Charlies Bellini   amphetamine -dextroamphetamine  20 MG tablet Commonly known as: Adderall One tab PO in the morning and 1/2 tab in the afternoon Start taking on: July 12, 2024 What changed: These instructions start on July 12, 2024. If you are unsure what to do until then, ask your doctor or other care provider. Changed by: Charlies Bellini   amphetamine -dextroamphetamine  20 MG tablet Commonly known as: Adderall One tab PO in the morning and 1/2 tab in the afternoon Start taking on: August 02, 2024 What changed: These instructions start on August 02, 2024. If you are unsure what to do until then, ask your doctor or other care provider. Changed by: Charlies Bellini   fluticasone 50 MCG/ACT nasal spray Commonly known as: FLONASE Place into both nostrils daily.   loratadine 10 MG tablet Commonly known as: CLARITIN Take 10 mg by mouth daily.   PATADAY OP Apply to eye.        All past medical history, surgical history, allergies, family history, immunizations andmedications were updated in the EMR today and reviewed under the history and medication portions of their EMR.      ROS 14 pt review of systems performed and negative (unless mentioned in an HPI)  Objective: BP 108/72   Pulse 76   Temp 98 F (36.7 C)   Wt 135 lb (61.2 kg)   SpO2 97%   BMI 22.91 kg/m  Physical Exam Vitals and nursing note reviewed.   Constitutional:      General: She is not in acute distress.    Appearance: Normal appearance. She is not toxic-appearing.  HENT:     Head: Normocephalic and atraumatic.  Eyes:     General: No scleral icterus.       Right eye: No discharge.        Left eye: No discharge.     Conjunctiva/sclera: Conjunctivae normal.  Pulmonary:     Effort: Pulmonary effort is normal.  Musculoskeletal:     Right lower leg: No edema.     Left lower leg: No edema.  Skin:    Findings:  No rash.  Neurological:     Mental Status: She is alert and oriented to person, place, and time. Mental status is at baseline.  Psychiatric:        Mood and Affect: Mood normal.        Behavior: Behavior normal.        Thought Content: Thought content normal.        Judgment: Judgment normal.      No results found.  Assessment/plan: April Bernard is a 38 y.o. female present for Chronic Conditions/illness Management Attention deficit disorder (ADD) without hyperactivity/Controlled substance agreement signed Stable Continue  Adderall 20/10 mg daily Indication: ADD Medication and dose: Adderall 20 mg QD # pills per month: 45 Date narcotic database last reviewed (include red flags):06/20/24 - follow up 3 mos on ADD   Pt has an appt in October. Cpe/cmc  No orders of the defined types were placed in this encounter.   Meds ordered this encounter  Medications   amphetamine -dextroamphetamine  (ADDERALL) 20 MG tablet    Sig: One tab PO in the morning and 1/2 tab in the afternoon    Dispense:  45 tablet    Refill:  0   amphetamine -dextroamphetamine  (ADDERALL) 20 MG tablet    Sig: One tab PO in the morning and 1/2 tab in the afternoon    Dispense:  45 tablet    Refill:  0   amphetamine -dextroamphetamine  (ADDERALL) 20 MG tablet    Sig: One tab PO in the morning and 1/2 tab in the afternoon    Dispense:  45 tablet    Refill:  0   Referral Orders  No referral(s) requested today     Electronically signed  by: Charlies Bellini, DO [facility] [place]

## 2024-08-26 ENCOUNTER — Encounter: Payer: Self-pay | Admitting: Family Medicine

## 2024-08-26 ENCOUNTER — Ambulatory Visit (INDEPENDENT_AMBULATORY_CARE_PROVIDER_SITE_OTHER): Admitting: Family Medicine

## 2024-08-26 VITALS — BP 106/74 | HR 79 | Temp 98.3°F | Ht 65.0 in | Wt 131.8 lb

## 2024-08-26 DIAGNOSIS — F988 Other specified behavioral and emotional disorders with onset usually occurring in childhood and adolescence: Secondary | ICD-10-CM | POA: Diagnosis not present

## 2024-08-26 DIAGNOSIS — Z131 Encounter for screening for diabetes mellitus: Secondary | ICD-10-CM | POA: Diagnosis not present

## 2024-08-26 DIAGNOSIS — Z130 Encounter for screening for diseases of the blood and blood-forming organs and certain disorders involving the immune mechanism: Secondary | ICD-10-CM | POA: Diagnosis not present

## 2024-08-26 DIAGNOSIS — E785 Hyperlipidemia, unspecified: Secondary | ICD-10-CM | POA: Diagnosis not present

## 2024-08-26 DIAGNOSIS — Z23 Encounter for immunization: Secondary | ICD-10-CM | POA: Diagnosis not present

## 2024-08-26 DIAGNOSIS — Z79899 Other long term (current) drug therapy: Secondary | ICD-10-CM

## 2024-08-26 DIAGNOSIS — Z1231 Encounter for screening mammogram for malignant neoplasm of breast: Secondary | ICD-10-CM | POA: Diagnosis not present

## 2024-08-26 DIAGNOSIS — Z0000 Encounter for general adult medical examination without abnormal findings: Secondary | ICD-10-CM

## 2024-08-26 DIAGNOSIS — E559 Vitamin D deficiency, unspecified: Secondary | ICD-10-CM | POA: Diagnosis not present

## 2024-08-26 LAB — COMPREHENSIVE METABOLIC PANEL WITH GFR
ALT: 33 U/L (ref 0–35)
AST: 26 U/L (ref 0–37)
Albumin: 4.6 g/dL (ref 3.5–5.2)
Alkaline Phosphatase: 70 U/L (ref 39–117)
BUN: 13 mg/dL (ref 6–23)
CO2: 27 meq/L (ref 19–32)
Calcium: 9.5 mg/dL (ref 8.4–10.5)
Chloride: 100 meq/L (ref 96–112)
Creatinine, Ser: 0.75 mg/dL (ref 0.40–1.20)
GFR: 100.1 mL/min (ref >60.00)
Glucose, Bld: 86 mg/dL (ref 70–99)
Potassium: 4 meq/L (ref 3.5–5.1)
Sodium: 136 meq/L (ref 135–145)
Total Bilirubin: 0.8 mg/dL (ref 0.2–1.2)
Total Protein: 7.4 g/dL (ref 6.0–8.3)

## 2024-08-26 LAB — IBC + FERRITIN
Ferritin: 75.6 ng/mL (ref 10.0–291.0)
Iron: 95 ug/dL (ref 42–145)
Saturation Ratios: 28.2 % (ref 20.0–50.0)
TIBC: 337.4 ug/dL (ref 250.0–450.0)
Transferrin: 241 mg/dL (ref 212.0–360.0)

## 2024-08-26 LAB — CBC
HCT: 40.3 % (ref 36.0–46.0)
Hemoglobin: 13.1 g/dL (ref 12.0–15.0)
MCHC: 32.6 g/dL (ref 30.0–36.0)
MCV: 89.5 fl (ref 78.0–100.0)
Platelets: 292 K/uL (ref 150.0–400.0)
RBC: 4.5 Mil/uL (ref 3.87–5.11)
RDW: 13.8 % (ref 11.5–15.5)
WBC: 6.4 K/uL (ref 4.0–10.5)

## 2024-08-26 LAB — LIPID PANEL
Cholesterol: 216 mg/dL — ABNORMAL HIGH (ref 0–200)
HDL: 83.4 mg/dL (ref >39.00)
LDL Cholesterol: 121 mg/dL — ABNORMAL HIGH (ref 0–99)
NonHDL: 132.21
Total CHOL/HDL Ratio: 3
Triglycerides: 55 mg/dL (ref 0.0–149.0)
VLDL: 11 mg/dL (ref 0.0–40.0)

## 2024-08-26 LAB — TSH: TSH: 0.31 u[IU]/mL — ABNORMAL LOW (ref 0.35–5.50)

## 2024-08-26 LAB — HEMOGLOBIN A1C: Hgb A1c MFr Bld: 5.3 % (ref 4.6–6.5)

## 2024-08-26 LAB — VITAMIN D 25 HYDROXY (VIT D DEFICIENCY, FRACTURES): VITD: 42.08 ng/mL (ref 30.00–100.00)

## 2024-08-26 MED ORDER — AMPHETAMINE-DEXTROAMPHETAMINE 20 MG PO TABS: ORAL_TABLET | ORAL | 0 refills | Status: DC

## 2024-08-26 NOTE — Patient Instructions (Addendum)
 Return in about 11 weeks (around 11/11/2024) for Routine chronic condition follow-up.        Great to see you today.  I have refilled the medication(s) we provide.   If labs were collected or images ordered, we will inform you of  results once we have received them and reviewed. We will contact you either by echart message, or telephone call.  Please give ample time to the testing facility, and our office to run,  receive and review results. Please do not call inquiring of results, even if you can see them in your chart. We will contact you as soon as we are able. If it has been over 1 week since the test was completed, and you have not yet heard from us , then please call us .    - echart message- for normal results that have been seen by the patient already.   - telephone call: abnormal results or if patient has not viewed results in their echart.  If a referral to a specialist was entered for you, please call us  in 2 weeks if you have not heard from the specialist office to schedule.

## 2024-08-26 NOTE — Progress Notes (Signed)
 Patient ID: April Bernard, female  DOB: 1986/02/20, 38 y.o.   MRN: [id number] Patient Care Team    Relationship Specialty Notifications Start End  Catherine Charlies LABOR, DO PCP - General Family Medicine  06/06/18   Gorge Ade, MD Consulting Physician Obstetrics and Gynecology  05/15/20      Chief Complaint  Patient presents with   Annual Exam    Pt is fasting.  Chronic condition management Influenza vaccine- given    Subjective: April Bernard is a 38 y.o.  Female  present for CPE and chronic Conditions/illness Management  All past medical history, surgical history, allergies, family history, immunizations, medications and social history were updated in the electronic medical record today. All recent labs, ED visits and hospitalizations within the last year were reviewed.  Health maintenance:  Colon cancer screen: no fhx- routine screen 44. Mom precancer polyps.  Mammogram: FHx, PGM. Routine screen 40> ordered [facility] Cervical cancer screening: last pap: 2021, completed by: (Dr. Darrow requested Immunizations: tdap 2021 UTD, Influenza-provided today (encouraged yearly), covid x3 Infectious disease screening: HIV  and hep c completed DEXA: routine screen and 59 Patient has a Dental home. Hospitalizations/ED visits: Reviewed  ADD/controlled substance agreement in place:  Pt reports she was diagnosed with ADD in middle school.  She has been prescribed Adderall off and on over the years, with discontinuation in high school, when she lost insurance  She was established with a psychiatrist in 2013 and they restarted the medication for her.   She reports Adderall 20/10 is working very well for her.  Patient is compliant with prescription and is denies negative side effects. Indication: ADD Medication and dose: Adderall  20/10 mg QD # pills per month: 45 contract signed (Y/N): Y Date narcotic database last reviewed (include red flags): 08/26/24      06/20/2024     9:11 AM 01/29/2024   10:26 AM 08/23/2023    9:37 AM 08/07/2023   11:06 AM 05/05/2023    8:45 AM  Depression screen PHQ 2/9  Decreased Interest 0 0 0 0 0  Down, Depressed, Hopeless 0 0 0 1 0  PHQ - 2 Score 0 0 0 1 0  Altered sleeping  0 0    Tired, decreased energy  1 2    Change in appetite  2 1    Feeling bad or failure about yourself   0 0    Trouble concentrating  1 0    Moving slowly or fidgety/restless  0 0    Suicidal thoughts  0 0    PHQ-9 Score  4 3    Difficult doing work/chores  Not difficult at all Somewhat difficult      Immunization History  Administered Date(s) Administered   Influenza, Seasonal, Injecte, Preservative Fre 08/07/2023, 08/26/2024   Influenza,inj,Quad PF,6+ Mos 09/19/2017, 09/01/2018   Influenza-Unspecified 08/24/2015, 08/23/2020   PFIZER(Purple Top)SARS-COV-2 Vaccination 01/05/2020, 02/05/2020, 08/23/2020   Tdap 05/08/2014, 12/02/2019   Past Medical History:  Diagnosis Date   Acute cholecystitis 07/27/2020   ADD (attention deficit disorder)    Allergic rhinitis    Chicken pox    Cholelithiases 06/26/2020   Depression    Ganglion cyst of right foot    History of pregnancy induced hypertension    Preeclampsia    2018   Vaginal Pap smear, abnormal    No Known Allergies Past Surgical History:  Procedure Laterality Date   CESAREAN SECTION  11/08/2016   CESAREAN SECTION N/A 03/04/2020   Procedure:  Repeat CESAREAN SECTION;  Surgeon: Gorge Ade, MD;  Location: [facility];  Service: Obstetrics;  Laterality: N/A;  EDD: 03/10/20   CHOLECYSTECTOMY N/A 07/27/2020   Procedure: LAPAROSCOPIC CHOLECYSTECTOMY WITH POSSIBLE  INTRAOPERATIVE CHOLANGIOGRAM;  Surgeon: Gladis Cough, MD;  Location: [facility];  Service: General;  Laterality: N/A;   REFRACTIVE SURGERY Bilateral 2011   Family History  Problem Relation Age of Onset   Arthritis Mother    Hypertension Mother    Colon polyps Mother        precancerous.   Hyperlipidemia Father    Arthritis Maternal  Grandmother    Hypertension Maternal Grandmother    Hypertension Maternal Grandfather    Heart disease Maternal Grandfather    Diabetes Maternal Grandfather    Hypertension Paternal Grandmother    Hyperlipidemia Paternal Grandmother    Breast cancer Paternal Grandmother 32   Hypertension Paternal Grandfather    Hyperlipidemia Paternal Grandfather    Prostate cancer Paternal Grandfather    Social History   Social History Narrative   Marital status/children/pets: married, 1 child.    Education/employment: B.S. Degree, works in [employer].   Safety:      -Wears a bicycle helmet riding a bike: No     -smoke alarm in the home:Yes     - wears seatbelt: Yes     - Feels safe in their relationships: Yes    Allergies as of 08/26/2024   No Known Allergies      Medication List        Accurate as of August 26, 2024 11:21 AM. If you have any questions, ask your nurse or doctor.          amphetamine -dextroamphetamine  20 MG tablet Commonly known as: Adderall One tab PO in the morning and 1/2 tab in the afternoon What changed: Another medication with the same name was changed. Make sure you understand how and when to take each. Changed by: Shyann Hefner   amphetamine -dextroamphetamine  20 MG tablet Commonly known as: Adderall One tab PO in the morning and 1/2 tab in the afternoon Start taking on: September 16, 2024 What changed: These instructions start on September 16, 2024. If you are unsure what to do until then, ask your doctor or other care provider. Changed by: Charlies Bellini   amphetamine -dextroamphetamine  20 MG tablet Commonly known as: Adderall One tab PO in the morning and 1/2 tab in the afternoon Start taking on: October 14, 2024 What changed: These instructions start on October 14, 2024. If you are unsure what to do until then, ask your doctor or other care provider. Changed by: Charlies Bellini   fluticasone 50 MCG/ACT nasal spray Commonly known as:  FLONASE Place into both nostrils daily.   loratadine 10 MG tablet Commonly known as: CLARITIN Take 10 mg by mouth daily.   PATADAY OP Apply to eye.        All past medical history, surgical history, allergies, family history, immunizations andmedications were updated in the EMR today and reviewed under the history and medication portions of their EMR.      Review of Systems  All other systems reviewed and are negative.  14 pt review of systems performed and negative (unless mentioned in an HPI)  Objective: BP 106/74   Pulse 79   Temp 98.3 F (36.8 C)   Ht 5' 5 (1.651 m)   Wt 131 lb 12.8 oz (59.8 kg)   LMP 08/05/2024   SpO2 98%   BMI 21.93 kg/m  Physical Exam Vitals  and nursing note reviewed.  Constitutional:      General: She is not in acute distress.    Appearance: Normal appearance. She is not ill-appearing or toxic-appearing.  HENT:     Head: Normocephalic and atraumatic.     Right Ear: Tympanic membrane, ear canal and external ear normal. There is no impacted cerumen.     Left Ear: Tympanic membrane, ear canal and external ear normal. There is no impacted cerumen.     Nose: No congestion or rhinorrhea.     Mouth/Throat:     Mouth: Mucous membranes are moist.     Pharynx: Oropharynx is clear. No oropharyngeal exudate or posterior oropharyngeal erythema.  Eyes:     General: No scleral icterus.       Right eye: No discharge.        Left eye: No discharge.     Extraocular Movements: Extraocular movements intact.     Conjunctiva/sclera: Conjunctivae normal.     Pupils: Pupils are equal, round, and reactive to light.  Cardiovascular:     Rate and Rhythm: Normal rate and regular rhythm.     Pulses: Normal pulses.     Heart sounds: Normal heart sounds. No murmur heard.    No friction rub. No gallop.  Pulmonary:     Effort: Pulmonary effort is normal. No respiratory distress.     Breath sounds: Normal breath sounds. No stridor. No wheezing, rhonchi or rales.   Chest:     Chest wall: No tenderness.  Abdominal:     General: Abdomen is flat. Bowel sounds are normal. There is no distension.     Palpations: Abdomen is soft. There is no mass.     Tenderness: There is no abdominal tenderness. There is no right CVA tenderness, left CVA tenderness, guarding or rebound.     Hernia: No hernia is present.  Musculoskeletal:        General: No swelling, tenderness or deformity. Normal range of motion.     Cervical back: Normal range of motion and neck supple. No rigidity or tenderness.     Right lower leg: No edema.     Left lower leg: No edema.  Lymphadenopathy:     Cervical: No cervical adenopathy.  Skin:    General: Skin is warm and dry.     Coloration: Skin is not jaundiced or pale.     Findings: No bruising, erythema, lesion or rash.  Neurological:     General: No focal deficit present.     Mental Status: She is alert and oriented to person, place, and time. Mental status is at baseline.     Cranial Nerves: No cranial nerve deficit.     Sensory: No sensory deficit.     Motor: No weakness.     Coordination: Coordination normal.     Gait: Gait normal.     Deep Tendon Reflexes: Reflexes normal.  Psychiatric:        Mood and Affect: Mood normal.        Behavior: Behavior normal.        Thought Content: Thought content normal.        Judgment: Judgment normal.      No results found.  Assessment/plan: April Bernard is a 38 y.o. female present for CPE and chronic Conditions/illness Management Attention deficit disorder (ADD) without hyperactivity/Controlled substance agreement signed Stable Continue Adderall 20/10 mg daily Indication: ADD Medication and dose: Adderall 20 mg QD # pills per month: 45 Date narcotic database last reviewed (  include red flags):08/26/24 - follow up 3 mos on ADD  Breast cancer screening by mammogram - MM 3D SCREENING MAMMOGRAM BILATERAL BREAST; Future Vitamin D  deficiency - Vitamin D  (25  hydroxy) Hyperlipidemia LDL goal <130 - Hemoglobin A1c - Lipid panel Diabetes mellitus screening - Hemoglobin A1c Influenza vaccine needed - Flu vaccine trivalent PF, 6mos and older(Flulaval,Afluria,Fluarix,Fluzone) Screening for deficiency anemia - IBC + Ferritin  Routine general medical examination at a health care facility (Primary) Patient was encouraged to exercise greater than 150 minutes a week. Patient was encouraged to choose a diet filled with fresh fruits and vegetables, and lean meats. AVS provided to patient today for education/recommendation on gender specific health and safety maintenance. Colon cancer screen: no fhx- routine screen 44. Mom precancer polyps.  Mammogram: FHx, PGM. Routine screen 40> ordered [facility] Cervical cancer screening: last pap: 2021, completed by: (Dr. Darrow requested Immunizations: tdap 2021 UTD, Influenza-provided today (encouraged yearly), covid x3 Infectious disease screening: HIV  and hep c completed DEXA: routine screen and 59  Orders Placed This Encounter  Procedures   MM 3D SCREENING MAMMOGRAM BILATERAL BREAST   Flu vaccine trivalent PF, 6mos and older(Flulaval,Afluria,Fluarix,Fluzone)   CBC   Comprehensive metabolic panel with GFR   Hemoglobin A1c   TSH   Lipid panel   Vitamin D  (25 hydroxy)   IBC + Ferritin    Meds ordered this encounter  Medications   amphetamine -dextroamphetamine  (ADDERALL) 20 MG tablet    Sig: One tab PO in the morning and 1/2 tab in the afternoon    Dispense:  45 tablet    Refill:  0   amphetamine -dextroamphetamine  (ADDERALL) 20 MG tablet    Sig: One tab PO in the morning and 1/2 tab in the afternoon    Dispense:  45 tablet    Refill:  0   amphetamine -dextroamphetamine  (ADDERALL) 20 MG tablet    Sig: One tab PO in the morning and 1/2 tab in the afternoon    Dispense:  45 tablet    Refill:  0   Referral Orders  No referral(s) requested today     Electronically signed by: Charlies Bellini,  DO [facility] [place]

## 2024-08-27 ENCOUNTER — Ambulatory Visit: Payer: Self-pay | Admitting: Family Medicine

## 2024-09-20 ENCOUNTER — Encounter: Payer: Self-pay | Admitting: Nurse Practitioner

## 2024-09-20 ENCOUNTER — Ambulatory Visit: Admitting: Nurse Practitioner

## 2024-09-20 ENCOUNTER — Ambulatory Visit: Payer: Self-pay

## 2024-09-20 VITALS — BP 120/74 | HR 81 | Temp 97.3°F | Ht 65.0 in | Wt 129.6 lb

## 2024-09-20 DIAGNOSIS — B354 Tinea corporis: Secondary | ICD-10-CM | POA: Diagnosis not present

## 2024-09-20 MED ORDER — TERBINAFINE HCL 250 MG PO TABS: 250.0000 mg | ORAL_TABLET | Freq: Every day | ORAL | 0 refills | Status: DC

## 2024-09-20 NOTE — Telephone Encounter (Signed)
 noted

## 2024-09-20 NOTE — Progress Notes (Signed)
   Acute Office Visit  Subjective:     Patient ID: April Bernard, female    DOB: 13-Apr-1986, 38 y.o.   MRN: [id number]  Chief Complaint  Patient presents with   Skin Probelm    2 days-base of scalp irritation    HPI  April Bernard is here for itching and redness to the back of her scalp. It started 2 days ago. She does not remember specific incident to start this. It does itch. She denies pain. Does not remember a tick on her, but did have a spider fall on her. She has not tried anything OTC.  ROS See pertinent positives and negatives per HPI.     Objective:    BP 120/74 (BP Location: Left Arm, Patient Position: Sitting, Cuff Size: Small)   Pulse 81   Temp (!) 97.3 F (36.3 C)   Ht 5' 5 (1.651 m)   Wt 129 lb 9.6 oz (58.8 kg)   LMP 09/06/2024 (Approximate)   SpO2 99%   Breastfeeding No   BMI 21.57 kg/m    Physical Exam Vitals and nursing note reviewed.  Constitutional:      General: She is not in acute distress.    Appearance: Normal appearance.  HENT:     Head: Normocephalic.  Eyes:     Conjunctiva/sclera: Conjunctivae normal.  Cardiovascular:     Rate and Rhythm: Normal rate.  Musculoskeletal:     Cervical back: Normal range of motion.  Skin:    General: Skin is warm.     Findings: Rash (circular rash to back of head, starts at scalp, goes up and around into hair/back of scalp) present.  Neurological:     General: No focal deficit present.     Mental Status: She is alert and oriented to person, place, and time.  Psychiatric:        Mood and Affect: Mood normal.        Behavior: Behavior normal.        Thought Content: Thought content normal.        Judgment: Judgment normal.      Assessment & Plan:   Tinea Corporus Rash located on scalp/in hair. Will be hard to treat with topical given location. Start terbinafine  250mg  daily x7 days, can extend to 14 if not improved. Discussed to avoid touching, wash hands frequently. Can use benadryl  as needed  for itching.    Meds ordered this encounter  Medications   terbinafine  (LAMISIL ) 250 MG tablet    Sig: Take 1 tablet (250 mg total) by mouth daily.    Dispense:  14 tablet    Refill:  0    Return if symptoms worsen or fail to improve.  Tinnie DELENA Harada, NP

## 2024-09-20 NOTE — Patient Instructions (Addendum)
 It was great to see you!  Start terbinafine  1 tablet daily for 7 days, if not fully healed, can continue for 14 days   Try to avoid touching, wash hands frequently   You can take benadryl  as needed for itching  Miconazole over the counter cream can help as well   Let's follow-up if your symptoms worsen or don't improve   Take care,  Tinnie Harada, NP

## 2024-09-20 NOTE — Telephone Encounter (Signed)
 FYI Only or Action Required?: FYI only for provider: appointment scheduled on 09/20/24.  Patient was last seen in primary care on 08/26/2024 by Catherine Fuller A, DO.  Called Nurse Triage reporting Insect Bite.  Symptoms began yesterday.  Interventions attempted: OTC medications: neosporin.  Symptoms are: stable.  Triage Disposition: See PCP When Office is Open (Within 3 Days)  Patient/caregiver understands and will follow disposition?: Yes Reason for Disposition  Bite starts to look bad (e.g., blister, purplish skin, ulcer)  (Exception: There is just minor swelling or small red bump.)  Answer Assessment - Initial Assessment Questions Patient states does not think the lump has not gotten bigger but her husband states the redness around it has spread. Put neosporin on it one time last night.  1. TYPE of INSECT: What type of insect was it?      Unsure, maybe a spider bite  2. ONSET: When did you get bitten?      Noticed yesterday, but 3-4 days ago had a weird spider on her and woke up next day and neck was sore, but states that speculation  3. LOCATION: Where is the insect bite located?      Back of neck right at hairline  4. REDNESS: Is the area red or pink? If Yes, ask: What size is the area of redness? (inches or cm). When did the redness start?     Red half semi circle   5. PAIN: Is there any pain? If Yes, ask: How bad is the pain? (Scale 0-10; or none, mild, moderate, severe)     Denies, and denies neck soreness  6. ITCHING: Does it itch? If Yes, ask: How bad is the itch?      Mild itching  7. SWELLING: How big is the swelling? (e.g., inches, cm, or compare to coins)     Nickel size lump  8. OTHER SYMPTOMS: Do you have any other symptoms?  (e.g., difficulty breathing, fever, hives)     Denies  Protocols used: Insect Bite-A-AH    Copied from CRM [id number]. Topic: Clinical - Red Word Triage >> Sep 20, 2024  9:51 AM Drema MATSU wrote: Red Word  that prompted transfer to Nurse Triage: Patient has a bug or spider bite on the back of her neck (by hairline) and is starting to form a circle. She states that it is red and irritated.

## 2024-10-15 ENCOUNTER — Encounter: Payer: Self-pay | Admitting: Family Medicine

## 2024-10-15 ENCOUNTER — Encounter: Payer: Self-pay | Admitting: Nurse Practitioner

## 2024-10-16 ENCOUNTER — Other Ambulatory Visit: Payer: Self-pay | Admitting: Family Medicine

## 2024-10-16 MED ORDER — TERBINAFINE HCL 250 MG PO TABS: 250.0000 mg | ORAL_TABLET | Freq: Every day | ORAL | 0 refills | Status: DC

## 2024-10-16 NOTE — Progress Notes (Signed)
 Refill of Lamisil  for 2 weeks, patient to follow-up in 2 weeks if symptoms are not resolved for reevaluation.  Originally seen by another provider

## 2024-10-29 ENCOUNTER — Ambulatory Visit: Payer: Self-pay | Admitting: Family Medicine

## 2024-10-29 ENCOUNTER — Encounter: Payer: Self-pay | Admitting: Family Medicine

## 2024-10-29 ENCOUNTER — Ambulatory Visit: Admitting: Family Medicine

## 2024-10-29 VITALS — BP 104/72 | HR 83 | Temp 98.0°F | Wt 134.8 lb

## 2024-10-29 DIAGNOSIS — F988 Other specified behavioral and emotional disorders with onset usually occurring in childhood and adolescence: Secondary | ICD-10-CM

## 2024-10-29 DIAGNOSIS — R21 Rash and other nonspecific skin eruption: Secondary | ICD-10-CM | POA: Diagnosis not present

## 2024-10-29 DIAGNOSIS — R7989 Other specified abnormal findings of blood chemistry: Secondary | ICD-10-CM | POA: Diagnosis not present

## 2024-10-29 DIAGNOSIS — B350 Tinea barbae and tinea capitis: Secondary | ICD-10-CM

## 2024-10-29 LAB — COMPREHENSIVE METABOLIC PANEL WITH GFR
ALT: 27 U/L (ref 3–35)
AST: 27 U/L (ref 5–37)
Albumin: 4.7 g/dL (ref 3.5–5.2)
Alkaline Phosphatase: 55 U/L (ref 39–117)
BUN: 17 mg/dL (ref 6–23)
CO2: 29 meq/L (ref 19–32)
Calcium: 9.9 mg/dL (ref 8.4–10.5)
Chloride: 101 meq/L (ref 96–112)
Creatinine, Ser: 0.8 mg/dL (ref 0.40–1.20)
GFR: 92.53 mL/min
Glucose, Bld: 78 mg/dL (ref 70–99)
Potassium: 4.3 meq/L (ref 3.5–5.1)
Sodium: 139 meq/L (ref 135–145)
Total Bilirubin: 0.6 mg/dL (ref 0.2–1.2)
Total Protein: 7.7 g/dL (ref 6.0–8.3)

## 2024-10-29 LAB — T3, FREE: T3, Free: 4 pg/mL (ref 2.3–4.2)

## 2024-10-29 MED ORDER — AMPHETAMINE-DEXTROAMPHETAMINE 20 MG PO TABS: ORAL_TABLET | ORAL | 0 refills | Status: AC

## 2024-10-29 MED ORDER — AMPHETAMINE-DEXTROAMPHETAMINE 20 MG PO TABS: ORAL_TABLET | ORAL | 0 refills | Status: DC

## 2024-10-29 MED ORDER — TERBINAFINE HCL 1 % EX CREA: 1.0000 | TOPICAL_CREAM | Freq: Two times a day (BID) | CUTANEOUS | 1 refills | Status: AC

## 2024-10-29 MED ORDER — GRISEOFULVIN ULTRAMICROSIZE 250 MG PO TABS: 375.0000 mg | ORAL_TABLET | Freq: Two times a day (BID) | ORAL | 0 refills | Status: DC

## 2024-10-29 NOTE — Progress Notes (Unsigned)
 "     Patient ID: April Bernard, female  DOB: 06-05-1986, 38 y.o.   MRN: [id number] Patient Care Team    Relationship Specialty Notifications Start End  Catherine Charlies LABOR, DO PCP - General Family Medicine  06/06/18   Gorge Ade, MD Consulting Physician Obstetrics and Gynecology  05/15/20      Chief Complaint  Patient presents with   ADD    Subjective: April Bernard is a 38 y.o.  Female  present for chronic Conditions/illness Management All past medical history, surgical history, allergies, family history, immunizations, medications and social history were updated in the electronic medical record today. All recent labs, ED visits and hospitalizations within the last year were reviewed.   ADD/controlled substance agreement in place:  Pt reports she was diagnosed with ADD in middle school.  She has been prescribed Adderall off and on over the years, with discontinuation in high school, when she lost insurance  She was established with a psychiatrist in 2013 and they restarted the medication for her.   She reports Adderall 20/10 is working very well for her.  Patient is compliant with prescription and is denies negative side effects. Indication: ADD Medication and dose: Adderall  20/10 mg QD # pills per month: 45 contract signed (Y/N): Y Date narcotic database last reviewed (include red flags): 10/29/2024      10/29/2024    9:05 AM 08/26/2024   11:28 AM 06/20/2024    9:11 AM 01/29/2024   10:26 AM 08/23/2023    9:37 AM  Depression screen PHQ 2/9  Decreased Interest 0 0 0 0 0  Down, Depressed, Hopeless 0 1 0 0 0  PHQ - 2 Score 0 1 0 0 0  Altered sleeping 1 2  0 0  Tired, decreased energy 1 1  1 2   Change in appetite 0 0  2 1  Feeling bad or failure about yourself  2 0  0 0  Trouble concentrating 0 0  1 0  Moving slowly or fidgety/restless 0 0  0 0  Suicidal thoughts 0 0  0 0  PHQ-9 Score 4 4   4  3    Difficult doing work/chores Not difficult at all Not difficult at all   Not difficult at all Somewhat difficult     Data saved with a previous flowsheet row definition    Immunization History  Administered Date(s) Administered   Influenza, Seasonal, Injecte, Preservative Fre 08/07/2023, 08/26/2024   Influenza,inj,Quad PF,6+ Mos 09/19/2017, 09/01/2018   Influenza-Unspecified 08/24/2015, 08/23/2020   PFIZER(Purple Top)SARS-COV-2 Vaccination 01/05/2020, 02/05/2020, 08/23/2020   Tdap 05/08/2014, 12/02/2019   Past Medical History:  Diagnosis Date   Acute cholecystitis 07/27/2020   ADD (attention deficit disorder)    Allergic rhinitis    Chicken pox    Cholelithiases 06/26/2020   Depression    Ganglion cyst of right foot    History of pregnancy induced hypertension    Preeclampsia    2018   Vaginal Pap smear, abnormal    No Known Allergies Past Surgical History:  Procedure Laterality Date   CESAREAN SECTION  11/08/2016   CESAREAN SECTION N/A 03/04/2020   Procedure: Repeat CESAREAN SECTION;  Surgeon: Gorge Ade, MD;  Location: [facility];  Service: Obstetrics;  Laterality: N/A;  EDD: 03/10/20   CHOLECYSTECTOMY N/A 07/27/2020   Procedure: LAPAROSCOPIC CHOLECYSTECTOMY WITH POSSIBLE  INTRAOPERATIVE CHOLANGIOGRAM;  Surgeon: Gladis Cough, MD;  Location: [facility];  Service: General;  Laterality: N/A;   REFRACTIVE SURGERY Bilateral 2011   Family  History  Problem Relation Age of Onset   Arthritis Mother    Hypertension Mother    Colon polyps Mother        precancerous.   Hyperlipidemia Father    Arthritis Maternal Grandmother    Hypertension Maternal Grandmother    Hypertension Maternal Grandfather    Heart disease Maternal Grandfather    Diabetes Maternal Grandfather    Hypertension Paternal Grandmother    Hyperlipidemia Paternal Grandmother    Breast cancer Paternal Grandmother 65   Hypertension Paternal Grandfather    Hyperlipidemia Paternal Grandfather    Prostate cancer Paternal Grandfather    Social History   Social History Narrative    Marital status/children/pets: married, 1 child.    Education/employment: B.S. Degree, works in [employer].   Safety:      -Wears a bicycle helmet riding a bike: No     -smoke alarm in the home:Yes     - wears seatbelt: Yes     - Feels safe in their relationships: Yes    Allergies as of 10/29/2024   No Known Allergies      Medication List        Accurate as of October 29, 2024 10:39 AM. If you have any questions, ask your nurse or doctor.          STOP taking these medications    PATADAY OP Stopped by: Charlies Bellini, DO   terbinafine  250 MG tablet Commonly known as: LAMISIL  Stopped by: Charlies Bellini, DO       TAKE these medications    amphetamine -dextroamphetamine  20 MG tablet Commonly known as: Adderall One tab PO in the morning and 1/2 tab in the afternoon What changed: Another medication with the same name was changed. Make sure you understand how and when to take each. Changed by: Charlies Bellini, DO   amphetamine -dextroamphetamine  20 MG tablet Commonly known as: Adderall One tab PO in the morning and 1/2 tab in the afternoon Start taking on: November 20, 2024 What changed: These instructions start on November 20, 2024. If you are unsure what to do until then, ask your doctor or other care provider. Changed by: Charlies Bellini, DO   amphetamine -dextroamphetamine  20 MG tablet Commonly known as: Adderall One tab PO in the morning and 1/2 tab in the afternoon Start taking on: December 10, 2024 What changed: These instructions start on December 10, 2024. If you are unsure what to do until then, ask your doctor or other care provider. Changed by: Charlies Bellini, DO   fluticasone 50 MCG/ACT nasal spray Commonly known as: FLONASE Place into both nostrils daily.   griseofulvin 250 MG tablet Commonly known as: GRIS-PEG Take 1.5 tablets (375 mg total) by mouth 2 (two) times daily. Started by: Charlies Bellini, DO   loratadine 10 MG tablet Commonly known as:  CLARITIN Take 10 mg by mouth daily.   terbinafine  1 % cream Commonly known as: LamISIL  AT Apply 1 Application topically 2 (two) times daily. Started by: Charlies Bellini, DO        All past medical history, surgical history, allergies, family history, immunizations andmedications were updated in the EMR today and reviewed under the history and medication portions of their EMR.      Review of Systems  Constitutional: Negative.   HENT: Negative.    Eyes: Negative.   Respiratory: Negative.    Cardiovascular: Negative.   Gastrointestinal: Negative.   Genitourinary: Negative.   Musculoskeletal: Negative.   Skin: Negative.   Neurological: Negative.  Endo/Heme/Allergies: Negative.   Psychiatric/Behavioral: Negative.    All other systems reviewed and are negative.  14 pt review of systems performed and negative (unless mentioned in an HPI)  Objective: BP 104/72   Pulse 83   Temp 98 F (36.7 C)   Wt 134 lb 12.8 oz (61.1 kg)   LMP 10/20/2024   SpO2 98%   BMI 22.43 kg/m  Physical Exam Vitals and nursing note reviewed.  Constitutional:      General: She is not in acute distress.    Appearance: Normal appearance. She is not ill-appearing, toxic-appearing or diaphoretic.  HENT:     Head: Normocephalic and atraumatic.  Eyes:     General: No scleral icterus.       Right eye: No discharge.        Left eye: No discharge.     Extraocular Movements: Extraocular movements intact.     Conjunctiva/sclera: Conjunctivae normal.     Pupils: Pupils are equal, round, and reactive to light.  Cardiovascular:     Rate and Rhythm: Normal rate and regular rhythm.  Pulmonary:     Effort: Pulmonary effort is normal. No respiratory distress.     Breath sounds: Normal breath sounds. No wheezing, rhonchi or rales.  Musculoskeletal:     Right lower leg: No edema.     Left lower leg: No edema.  Skin:    General: Skin is warm.     Findings: Rash present.     Comments: 7.5 x 4.5 cm ovoid  erythemic ring left posterior neck/scalp with mild scaliness  Neurological:     Mental Status: She is alert and oriented to person, place, and time. Mental status is at baseline.     Motor: No weakness.     Gait: Gait normal.  Psychiatric:        Mood and Affect: Mood normal.        Behavior: Behavior normal.        Thought Content: Thought content normal.        Judgment: Judgment normal.     No results found.  Assessment/plan: April Bernard is a 38 y.o. female present forchronic Conditions/illness Management Attention deficit disorder (ADD) without hyperactivity/Controlled substance agreement signed Stable Continue Adderall 20/10 mg daily Indication: ADD Medication and dose: Adderall 20 mg QD # pills per month: 45 Date narcotic database last reviewed (include red flags):10/29/2024 - follow up 3 mos on ADD  Abnl tsh Abnormal TSH 3 months ago, repeat TSH, T4 free and T3 free today  Rash/tinea capitis: Rash most likely is tinea capitis by exam today.  Suspect she did not have complete resolution of her symptoms secondary to inadequate duration of treatment or on diagnoses.  She has been treated with ivermectin for 2 weeks, and then symptoms returned and she was treated for another 2 weeks in which she did not have complete resolution. Will check Lyme titers to be complete Start griseofulvin twice daily for 12 weeks.  This is her preferred formulary at the [facility].  Therefore I called into [facility]. Topically on neck will add terbinafine  cream twice daily Will recheck in 4 weeks, sooner if not seeing improvement after 4 weeks of treatment  Orders Placed This Encounter  Procedures   TSH + free T4   T3, free   Comp Met (CMET)   B. burgdorfi antibodies    Meds ordered this encounter  Medications   terbinafine  (LAMISIL  AT) 1 % cream    Sig: Apply 1 Application  topically 2 (two) times daily.    Dispense:  30 g    Refill:  1   amphetamine -dextroamphetamine   (ADDERALL) 20 MG tablet    Sig: One tab PO in the morning and 1/2 tab in the afternoon    Dispense:  45 tablet    Refill:  0   DISCONTD: amphetamine -dextroamphetamine  (ADDERALL) 20 MG tablet    Sig: One tab PO in the morning and 1/2 tab in the afternoon    Dispense:  45 tablet    Refill:  0   DISCONTD: amphetamine -dextroamphetamine  (ADDERALL) 20 MG tablet    Sig: One tab PO in the morning and 1/2 tab in the afternoon    Dispense:  45 tablet    Refill:  0   griseofulvin (GRIS-PEG) 250 MG tablet    Sig: Take 1.5 tablets (375 mg total) by mouth 2 (two) times daily.    Dispense:  168 tablet    Refill:  0   amphetamine -dextroamphetamine  (ADDERALL) 20 MG tablet    Sig: One tab PO in the morning and 1/2 tab in the afternoon    Dispense:  45 tablet    Refill:  0   amphetamine -dextroamphetamine  (ADDERALL) 20 MG tablet    Sig: One tab PO in the morning and 1/2 tab in the afternoon    Dispense:  45 tablet    Refill:  0   Referral Orders  No referral(s) requested today     Electronically signed by: Charlies Bellini, DO [facility] "

## 2024-10-30 DIAGNOSIS — B350 Tinea barbae and tinea capitis: Secondary | ICD-10-CM | POA: Insufficient documentation

## 2024-10-30 MED ORDER — GRISEOFULVIN ULTRAMICROSIZE 250 MG PO TABS: 375.0000 mg | ORAL_TABLET | Freq: Two times a day (BID) | ORAL | 0 refills | Status: AC

## 2024-11-01 LAB — TSH+FREE T4: TSH W/REFLEX TO FT4: 0.85 m[IU]/L

## 2024-11-01 LAB — B. BURGDORFI ANTIBODIES: B burgdorferi Ab IgG+IgM: <0.9 {index}

## 2024-11-04 NOTE — Telephone Encounter (Signed)
"  No further action needed at this time.  "

## 2024-11-11 ENCOUNTER — Ambulatory Visit: Admitting: Family Medicine

## 2024-12-16 ENCOUNTER — Ambulatory Visit: Admitting: Family Medicine
